# Patient Record
Sex: Male | Born: 1970 | Race: White | Hispanic: No | Marital: Married | Smoking: Former smoker
Health system: Southern US, Community
[De-identification: ages and names within clinical notes are randomized; demographics above are authoritative.]

## PROBLEM LIST (undated history)

## (undated) DIAGNOSIS — K219 Gastro-esophageal reflux disease without esophagitis: Secondary | ICD-10-CM

## (undated) DIAGNOSIS — E785 Hyperlipidemia, unspecified: Secondary | ICD-10-CM

## (undated) DIAGNOSIS — Z789 Other specified health status: Secondary | ICD-10-CM

## (undated) DIAGNOSIS — G4700 Insomnia, unspecified: Secondary | ICD-10-CM

## (undated) HISTORY — PX: TOTAL HIP ARTHROPLASTY: SHX124

## (undated) HISTORY — PX: TONSILLECTOMY: SUR1361

## (undated) HISTORY — DX: Other specified health status: Z78.9

## (undated) HISTORY — DX: Hyperlipidemia, unspecified: E78.5

## (undated) HISTORY — DX: Gastro-esophageal reflux disease without esophagitis: K21.9

## (undated) HISTORY — DX: Insomnia, unspecified: G47.00

---

## 1999-03-18 ENCOUNTER — Emergency Department: Admit: 1999-03-18 | Payer: Self-pay | Source: Emergency Department | Admitting: Emergency Medicine

## 2002-07-18 HISTORY — PX: OTHER SURGICAL HISTORY: SHX169

## 2006-07-27 ENCOUNTER — Ambulatory Visit: Admission: RE | Admit: 2006-07-27 | Payer: Self-pay | Source: Ambulatory Visit | Admitting: Colon & Rectal Surgery

## 2007-02-05 ENCOUNTER — Emergency Department: Admit: 2007-02-05 | Payer: Self-pay | Source: Emergency Department | Admitting: Emergency Medicine

## 2008-09-05 ENCOUNTER — Ambulatory Visit
Admit: 2008-09-05 | Disposition: A | Discharge status: Home / Self Care | Payer: Self-pay | Source: Ambulatory Visit | Admitting: Foot & Ankle Surgery

## 2009-03-09 ENCOUNTER — Ambulatory Visit
Admit: 2009-03-09 | Disposition: A | Discharge status: Home / Self Care | Payer: Self-pay | Source: Ambulatory Visit | Admitting: Adult Reconstructive Orthopaedic Surgery

## 2009-03-09 LAB — CBC AND DIFFERENTIAL
Basophils Absolute: 0 /mm3 (ref 0.0–0.2)
Basophils: 0 % (ref 0–2)
Eosinophils Absolute: 0.3 /mm3 (ref 0.0–0.7)
Eosinophils: 3 % (ref 0–5)
Granulocytes Absolute: 3.8 /mm3 (ref 1.8–8.1)
Hematocrit: 44.9 % (ref 42.0–52.0)
Hgb: 14.8 G/DL (ref 13.0–17.0)
Immature Granulocytes Absolute: 0 CUMM (ref 0.0–0.0)
Immature Granulocytes: 0 % (ref 0–1)
Lymphocytes Absolute: 2.8 /mm3 (ref 0.5–4.4)
Lymphocytes: 38 % (ref 15–41)
MCH: 29.5 PG (ref 28.0–32.0)
MCHC: 33 G/DL (ref 32.0–36.0)
MCV: 89.6 FL (ref 80.0–100.0)
MPV: 9.4 FL (ref 9.4–12.3)
Monocytes Absolute: 0.7 /mm3 (ref 0.0–1.2)
Monocytes: 9 % (ref 0–11)
Neutrophils %: 50 % — ABNORMAL LOW (ref 52–75)
Platelets: 226 /mm3 (ref 140–400)
RBC: 5.01 /mm3 (ref 4.70–6.00)
RDW: 13.2 % (ref 11.5–15.0)
WBC: 7.54 /mm3 (ref 3.50–10.80)

## 2009-03-09 LAB — URINE MICROSCOPIC

## 2009-03-09 LAB — URINALYSIS
Bilirubin, UA: NEGATIVE
Glucose, UA: NEGATIVE
Ketones UA: NEGATIVE
Leukocyte Esterase, UA: NEGATIVE
Nitrite, UA: NEGATIVE
Protein, UR: NEGATIVE
Specific Gravity UA POCT: 1.025 (ref <=1.030)
Urine pH: 6 (ref 5.0–8.0)
Urobilinogen, UA: 0.2

## 2009-03-09 LAB — COMPREHENSIVE METABOLIC PANEL
ALT: 23 U/L (ref 21–72)
AST (SGOT): 25 U/L (ref 20–57)
Albumin/Globulin Ratio: 1.3 (ref 1.1–1.8)
Albumin: 4 G/DL (ref 3.7–5.1)
Alkaline Phosphatase: 90 U/L (ref 43–122)
BUN: 15 MG/DL (ref 7–21)
Bilirubin, Total: 0.2 MG/DL (ref 0.2–1.3)
CO2: 30 MEQ/L (ref 22–31)
Calcium: 9.2 MG/DL (ref 8.6–10.2)
Chloride: 103 MEQ/L (ref 98–107)
Creatinine: 0.7 MG/DL (ref 0.5–1.4)
Globulin: 3 G/DL (ref 2.0–3.7)
Glucose: 122 MG/DL — ABNORMAL HIGH (ref 70–105)
Potassium: 4.6 MEQ/L (ref 3.6–5.0)
Protein, Total: 7 G/DL (ref 6.0–8.0)
Sodium: 142 MEQ/L (ref 136–143)

## 2009-03-09 LAB — ABO/RH

## 2009-03-09 LAB — PT AND APTT
PT INR: 1 {INR} (ref 0.9–1.1)
PT: 12.2 s (ref 10.8–13.3)
PTT: 18 s — ABNORMAL LOW (ref 21–32)

## 2009-03-09 LAB — GFR

## 2009-03-09 LAB — CALCIUM IONIZED-CALC. CERNER: Calcium Ionized Calculated: 2 mEQ/L (ref 1.9–2.3)

## 2009-03-09 LAB — ANTIBODY SCREEN: AB Screen Gel: NEGATIVE

## 2009-03-18 ENCOUNTER — Inpatient Hospital Stay
Admission: RE | Admit: 2009-03-18 | Disposition: A | Discharge status: Home / Self Care | Payer: Self-pay | Source: Ambulatory Visit | Admitting: Adult Reconstructive Orthopaedic Surgery

## 2009-03-18 LAB — CBC
Hematocrit: 38.3 % — ABNORMAL LOW (ref 42.0–52.0)
Hematocrit: 39 % — ABNORMAL LOW (ref 42.0–52.0)
Hgb: 12.5 G/DL — ABNORMAL LOW (ref 13.0–17.0)
Hgb: 12.8 G/DL — ABNORMAL LOW (ref 13.0–17.0)
MCH: 29.5 PG (ref 28.0–32.0)
MCH: 29.6 PG (ref 28.0–32.0)
MCHC: 32.6 G/DL (ref 32.0–36.0)
MCHC: 32.8 G/DL (ref 32.0–36.0)
MCV: 90.3 FL (ref 80.0–100.0)
MCV: 90.1 FL (ref 80.0–100.0)
MPV: 9.6 FL (ref 9.4–12.3)
MPV: 9.7 FL (ref 9.4–12.3)
Platelets: 173 /mm3 (ref 140–400)
Platelets: 174 /mm3 (ref 140–400)
RBC: 4.24 /mm3 — ABNORMAL LOW (ref 4.70–6.00)
RBC: 4.33 /mm3 — ABNORMAL LOW (ref 4.70–6.00)
RDW: 13.5 % (ref 11.5–15.0)
RDW: 13.4 % (ref 11.5–15.0)
WBC: 10.46 /mm3 (ref 3.50–10.80)
WBC: 9.35 /mm3 (ref 3.50–10.80)

## 2009-03-18 LAB — TYPE AND SCREEN: AB Screen Gel: NEGATIVE

## 2009-03-19 LAB — HEMOGLOBIN AND HEMATOCRIT, BLOOD
Hematocrit: 38.3 % — ABNORMAL LOW (ref 42.0–52.0)
Hgb: 12.3 G/DL — ABNORMAL LOW (ref 13.0–17.0)

## 2009-03-19 LAB — BASIC METABOLIC PANEL
BUN: 11 MG/DL (ref 7–21)
CO2: 27 MEQ/L (ref 22–31)
Calcium: 8.2 MG/DL — ABNORMAL LOW (ref 8.6–10.2)
Chloride: 103 MEQ/L (ref 98–107)
Creatinine: 0.7 MG/DL (ref 0.5–1.4)
Glucose: 123 MG/DL — ABNORMAL HIGH (ref 70–105)
Potassium: 4.7 MEQ/L (ref 3.6–5.0)
Sodium: 137 MEQ/L (ref 136–143)

## 2009-03-19 LAB — GFR

## 2009-03-19 LAB — PT/INR
PT INR: 1.2 {INR} — ABNORMAL HIGH (ref 0.9–1.1)
PT: 13.8 s — ABNORMAL HIGH (ref 10.8–13.3)

## 2009-03-20 LAB — HEMOGLOBIN AND HEMATOCRIT, BLOOD
Hematocrit: 36.3 % — ABNORMAL LOW (ref 42.0–52.0)
Hgb: 11.7 G/DL — ABNORMAL LOW (ref 13.0–17.0)

## 2009-03-20 LAB — PT/INR
PT INR: 1.1 {INR} (ref 0.9–1.1)
PT: 13.1 s (ref 10.8–13.3)

## 2011-04-12 LAB — ECG 12-LEAD
Atrial Rate: 88 {beats}/min
P Axis: 38 degrees
P-R Interval: 138 ms
Q-T Interval: 362 ms
QRS Duration: 90 ms
QTC Calculation (Bezet): 438 ms
R Axis: 49 degrees
T Axis: 51 degrees
Ventricular Rate: 88 {beats}/min

## 2011-05-05 NOTE — Op Note (Signed)
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            PROCEDURE:  EXAMINATION UNDER ANESTHESIA, INCISION AND DRAINAGE OF ANTERIOR      ANORECTAL ABSCESS AND SINGLE STAGE FISTULOTOMY.            ANESTHESIA:  IVA.            INDICATION FOR OPERATION:  The patient is a 40 year old male with a      previous history of anal fissure in the distance past, who developed      abscess secondary to the fissure anteriorly, which was incised and drained.      He now presents with recurrence of this abscess and drainage in the      anterior perineum at the previous site of his other surgery.  He now      presents for examination under anesthesia, incision and drainage of abscess      and possible fistulotomy.            DESCRIPTION OF PROCEDURE:  Under successful IV anesthetic the patient was      placed in the left lateral position and anesthesia was further supplemented      with local injection of 0.50% Marcaine with epinephrine.  Once      intersphincteric block was obtained the anus was dilated up to two      fingerbreadths and a medium size Hill-Ferguson retractor was placed in the      anal canal.  There was an anterior anal fistula opening which tunneled out      through the skin, this was incised utilizing electrocautery.  The excess      skin tag was excised.  The tract appeared to be very superficial and did      not impact upon the internal sphincter, only a shallow portion of the      external sphincter muscle.  External skin tag was then completely excised.      The anterior fistula tract was then  curetted with a small bone curette.      Skin tag was sent for permanent section.  Iodoform gauze was placed in the      anal canal, absorbable dressing was placed over the wound.  The patient  left the operating room in good condition.                                                Electronic Signing MD: Judie Bonus, MD  ([id number])            D: 07/27/2006 by Judie Bonus, MD      T: 07/27/2006 by [id number] ([id number]) ([id number])      cc:  Judie Bonus, MD

## 2011-05-06 NOTE — Op Note (Signed)
Account Number: [id number]      Document ID: [id number]      Admit Date: 03/18/2009      Procedure Date: 03/18/2009            Patient Location: [facility]01      Patient Type: I            SURGEON: Meyer Cory MD      ASSISTANT:                  PREOPERATIVE DIAGNOSIS:      Avascular necrosis of the left hip            POSTOPERATIVE DIAGNOSIS:      Avascular necrosis of the left hip            PROCEDURE PERFORMED:      Left total hip arthroplasty.            ANESTHESIA:      Epidural and spinal.            ESTIMATED BLOOD LOSS:      950 mL.            URINE OUTPUT:      250 mL.            INTRAVENOUS FLUIDS:      Crystalloid.            COMPLICATIONS:      None.            DISPOSITION:      To PACU in stable condition.            IMPLANTS:      Zimmer Trilogy 58-mm acetabular shell, lot #[id number]; Zimmer Longevity      cross-linked polyethylene liner, inner diameter size 32 mm, lot #[id number];      Biolox Delta ceramic femoral head, size 32 with a 3.5 mm neck length, lot      #[id number]; and a VerSys Epoch femoral stem extended offset, size 15, full      coat, lot #[id number].  He also had 2 additional 6.5-mm acetabular screws.            INDICATIONS FOR SURGERY:      The patient is a pleasant 40 year old male who was previously diagnosed      with Perthes at age 58.  He has undergone a hip scope as well as a previous      arthrotomy and synovectomy at age 54.  He was evaluated in our clinic      because of his worsening left hip pain that failed both operative and      nonoperative treatments.  He was deemed to be a candidate for total hip      arthroplasty and informed consent was obtained.            DESCRIPTION OF PROCEDURE:      Once the patient was properly consented and the operative site was marked      with the surgeon's initials, the patient was evaluated by the      anesthesiologist who placed an epidural and spinal anesthetic.  He was then      brought to the operating room where he was gently transferred onto the       operating table and a Foley catheter was placed.  He was repositioned into      the lateral decubitus position and left lower extremity was prepared and      draped in the usual  sterile fashion.  After the timeout had been performed,      the planned surgical incision was marked using a sterile marking pen over      the posterior aspect of the greater trochanter for a standard      posterolateral approach.  The skin incision was made using a #10 blade.      This was carried down through the skin and subcutaneous tissue.  Bovie      electrocautery was used to maintain meticulous hemostasis and the fascia      was identified and incised in line with the fibers of the gluteus maximus      muscle.  The Charnley retractor was and the leg was brought into gentle      internal rotation.  Of note, the patient had significant contractures given      his large body habitus and longstanding joint pain.  The skin incision was      then extended to allow proper visualization give his large body habitus.      With the Charnley in place and the leg carefully internally rotated to      protect the sciatic nerve, the trochanteric bursa was elevated and      retractors were placed deep to the gluteus medius tendon.  The short      external rotators and piriformis tendon were identified.  A plane was      developed proximal to the piriformis tendon and deep to the gluteus      minimus.  With the _____ retractor in place, the short external rotators      and piriformis tendon were released at their insertion and a capsulotomy      was performed.  Surgically dislocation was then performed and the planned      femoral neck cut was marked using the lessor trochanter as a reference      point.  The sagittal saw was used to complete the neck cut and the      patient's femoral head was evaluated and was noted to have end-stage      disease as had been seen radiographically.  This was passed off the field      as a specimen.  Next,  retractors were placed anteriorly, inferiorly, as      well as a self-retaining retractor proximally and the acetabulum was      closely inspected.  Once properly visualized the remnant labrum was      resected using a long-handled knife.  Reaming was then begun and performed      in a sequential fashion up to a size 56 reamer.  A trial component was      placed.  This was noted to fit well.  Care was taken to place this in 45      degrees of abduction and 20 degrees of anteversion.  With this in place,      attention was returned to the femur.  Here, a box cutting osteotome was      used to make the entry point into the proximal femur.  The canal finder was      then inserted and reaming was performed, first to lateralize. This was      performed in a sequential fashion up to a size 15.  Broaching was then      performed and the patient was noted to fit well with a size 15 broach.      Trialing was performed  and he was noted to have equal limb lengths and was      stable with an extended offset neck with a +3.5 mm neck length on the 32-mm      femoral head.  He was able to achieve full extension and 45 degrees of      external rotation and was stable with 90 degrees of forward flexion and 60      degrees of internal rotation.  The trial components were then removed.  The      wound was copiously irrigated with normal saline and the final reaming was      performed of the acetabulum and a 58-mm acetabular shell was then inserted.       The polyethylene liner was also inserted and the locking ring was noted to      be intact.  Next the femoral component was then [facility] into place, again      taking great care to match the patient's femoral anteversion.  This was      noted to seat well.  The trunion was cleaned and dried and after final      trialing the ceramic femoral head was placed.  He was taken through a range      of motion and was once again stable as I had previously mentioned and his      limb lengths  were equal as measured at the tibial tuberosities with the      legs in flexion.  The previously placed #5 Tycron in the posterior capsule      and piriformis tendon was then used to perform a posterior repair.  The      wound was copiously irrigated on last time with normal saline and layered      closure was performed with #1 Vicryl for the fascia.  The overlying soft      tissue was reapproximated using 2-0 Vicryl suture.  Ultimate skin closure      was achieved using 4-0 Monocryl in a running subcuticular stitch and      Dermabond skin glue was applied.  A sterile dressing was applied.  The      drapes were removed.  He was repositioned into the supine position and      taken to postanesthesia care unit in stable condition.  Of note, the      attending physician, Dr. Meyer Cory, was present and scrubbed throughout      the entire procedure and there were no complications that occurred during      this case.            POSTOPERATIVE PLAN:      1.  Given the patient's large body habitus, we will place him on 50%      weightbearing for 4 weeks.      2.  He will be on Coumadin for 4 weeks for DVT prophylaxis.      3.  He will receive perioperative prophylactic IV antibiotics.                        Electronic Signing Provider      _______________________________     Date/Time Signed: _____________      Meyer Cory MD ([id number])            D:  03/18/2009 21:45 PM by Dr. Antonietta Jewel, MD ([id number])      T:  03/19/2009 00:12 AM by [id number]          (  Conf: [id number]) (Doc ID: [id number])                  FA:[id number] Thomasenia Bottoms MD

## 2011-05-06 NOTE — Discharge Summary (Signed)
Account Number: [id number]      Document ID: [id number]      Admit Date: 03/18/2009      Discharge Date: 03/20/2009            ATTENDING PHYSICIAN:  Meyer Cory, MD                  PREOPERATIVE DIAGNOSIS:      Left hip avascular necrosis.            DISCHARGE DIAGNOSIS:      Left hip avascular necrosis.            PROCEDURE PERFORMED:      Left total hip arthroplasty on March 18, 2009.            HISTORY OF PRESENT ILLNESS:      The patient is a pleasant 40 year old male who had been previously      diagnosed with  Perthes at the age of 35.  He had undergone a previous hip      arthroscopy, as well as arthrotomy at age 32.  The patient has had      longstanding left hip pain, and at this time had failed both operative and      nonoperative treatment.  He had been evaluated in our clinic, and after      being seen radiographically and clinically he was deemed to be a candidate      for total hip arthroplasty.  He presents for this hospitalization for a      left total hip replacement.            HOSPITAL COURSE:      After undergoing an uncomplicated left total hip arthroplasty on March 18, 2009, by Dr. Meyer Cory, the patient was admitted to orthopedic      surgery ward.  He was provided with perioperative prophylaxis IV Ancef, as      well as Coumadin for DVT prophylaxis.  Internal medicine was consulted to      assist with his medical management and physical therapy was consulted to      assist with his rehabilitation.  Due to the patient's size, he was kept at      50% weightbearing on the left lower extremity and this will continue for a      period of 4 weeks.  In addition, he was kept on posterior hip precautions.      He is provided with both oral and IV pain medication, and was noted to be      transitioned or oral pain medication prior to discharge.  His vital signs      were closely monitored and he remained afebrile throughout this      hospitalization and cardiovascularly stable.  His hematocrit  and      electrolytes were also closely monitored, and he did not require a blood      transfusion.  On the day of discharge, he was tolerating regular diet.  His      wound was healing without incident.  He was voiding without difficulty.      His pain was well-controlled on oral pain medication and he had been      cleared for discharge by both internal medicine, as well as physical      therapy.            DISPOSITION:      To home.  DISCHARGE DIET:      Regular.            DISCHARGE ACTIVITY:      He is posterior hip precautions and 50% weightbearing on the right lower      extremity.  He will be 50% weightbearing for 4 weeks until he is seen at      his follow up appointment.  He may remove his outer dressing in 48 hours,      and is okay for him to shower, but he should not submerge his wound by tub,      bathing, or swimming.            DISCHARGE MEDICATIONS:      In addition, to his previous outpatient regimen we will provide him with a      prescription for Percocet p.r.n. for pain control, Coumadin 2.5 mg daily      for DVT prophylaxis.  He will continue this for 1 month.  His INR will be      drawn and reported to our office so that we may further manage his Coumadin      dosing.  We also provided him with a prescription for multivitamin and a      stool softener.            FOLLOWUP:      We will see the patient back in the orthopedic surgery clinic in 4 weeks      for routine follow up and clinical evaluation.                        Electronic Signing Provider      _______________________________     Date/Time Signed: _____________      Meyer Cory MD ([id number])            D:  03/20/2009 11:38 AM by Dr. Antonietta Jewel, MD ([id number])      T:  03/20/2009 13:15 PM by [id number]          Everlean Cherry: [id number]) (Doc ID: [id number])                        YK:[id number] Thomasenia Bottoms MD

## 2011-05-06 NOTE — Consults (Signed)
Account Number: [id number]      Document ID: [id number]      Admit Date: 03/09/2009      Service Date: 03/08/2009            Patient Location: DISCHARGED 03/09/2009      Patient Type: S            CONSULTING PHYSICIAN: Anselmo Pickler MD            REFERRING PHYSICIAN: Meyer Cory MD                  CONSULTING SERVICE:      Medicine.            REASON FOR CONSULTATION:      Preoperative medical clearance.            TYPE OF SURGERY:      Left total hip replacement.            DATE OF SURGERY:      March 18, 2009.            HOSPITAL:      [facility].            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            PAST MEDICAL HISTORY:      Otherwise significant for exogenous obesity.            PAST SURGICAL HISTORY:      Hemorrhoidectomy, left hip arthrotomy, and tonsillectomy/adenoidectomy.            MEDICATIONS:      Vicodin and Valium on a p.r.n. basis.            ALLERGIES:      CELEBREX causes itching.            SOCIAL HISTORY:      He has 1 to 2 alcoholic beverages daily on most days and uses marijuana.      He stopped smoking several months ago and has a 15-pack-year smoking.            FAMILY HISTORY:      Noncontributory.            PREVIOUS ANESTHESIA REACTIONS:      None.            BLEEDING TENDENCIES:      None.            REVIEW OF SYSTEMS:      Denies exertional chest pain, shortness of breath, dizziness,      lightheadedness, or palpitations.  No headache, double vision, dysarthria,      or unilateral extremity weakness.  No recent change in bowel or bladder      function.  He snores but has never been diagnosed with sleep apnea.            PHYSICAL EXAMINATION:      GENERAL:  Reveals a normally developed, obese Caucasian gentleman.      VITAL SIGNS:  Height  is 5 feet 11 inches, weight is 142.8 kg., blood      pressure 152/76, heart rate is 88, respirations 16.  He is awake, alert,      and oriented to person, place, and date.      HEENT:  Pupils equal and reactive to light.  Extraocular movements intact.      NECK:  Supple, no thyromegaly.      LUNGS:  Clear, no wheezes.      HEART:  S1, S2, regular with no audible murmur.      ABDOMEN:  Soft, protuberant, nontender.  Bowel sounds normal.      GENITAL/RECTAL:  Not done.      NEUROLOGIC:  Nonfocal.  Cranial nerves are intact.      EXTREMITIES:  No edema.  Homans sign is negative.  Peripheral pulses are      palpable.            LABORATORY AND DIAGNOSTIC TESTING:      CMP is within normal limits.  CBC is within normal limits.  INR is 1.0,      APTT is 18.  UA is negative except for trace blood, 2 to 5 RBCs.  EKG:      Normal sinus rhythm, rate 88, normal intervals, normal axis, within normal      limits.            IMPRESSION:      1.  Left total hip replacement per orthopedic surgery.      2.  DVT prophylaxis per protocol.      3.  GI prophylaxis per protocol.      4.  Exogenous obesity.      5.  Marijuana use which he was advised to stop today in anticipation of      surgery.      6.  Alcohol use, which he was advised to stop as of today in      anticipation of surgery.      7.  CELEBREX allergy.      8.  High blood pressure reading today for which he will be given Toprol-XL      25 mg to begin today.  It will be held for a systolic blood pressure of      less than 110 mmHg or heart rate less than 50 beats per minute.            PLAN:      He is medically cleared for proposed surgery and should be at low risk for      this intermediate risk surgery.  He was advised to not take aspirin, NSAIDs      or any complimentary alternative medications between now and the time of      surgery.                        Electronic Signing Provider      _______________________________     Date/Time Signed: _____________      Anselmo Pickler MD [phone])            D:  03/09/2009 16:49 PM by Dr. Anselmo Pickler, MD [phone])      T:  03/10/2009 06:53 AM by [id number]          Everlean Cherry: [id number]) (Doc ID: [id number])                  KG:URKYH Lossie Faes MD      Meyer Cory MD

## 2011-05-17 ENCOUNTER — Emergency Department
Admit: 2011-05-17 | Discharge: 2011-05-17 | Disposition: A | Discharge status: Home / Self Care | Payer: Self-pay | Source: Emergency Department | Admitting: Emergency Medicine

## 2012-04-25 ENCOUNTER — Other Ambulatory Visit: Payer: Self-pay | Admitting: Chiropractor

## 2012-11-15 DIAGNOSIS — J40 Bronchitis, not specified as acute or chronic: Secondary | ICD-10-CM

## 2012-11-15 HISTORY — DX: Bronchitis, not specified as acute or chronic: J40

## 2012-12-04 ENCOUNTER — Emergency Department: Payer: BLUE CROSS/BLUE SHIELD

## 2012-12-04 ENCOUNTER — Inpatient Hospital Stay: Payer: BLUE CROSS/BLUE SHIELD | Admitting: Hospitalist

## 2012-12-04 ENCOUNTER — Inpatient Hospital Stay
Admission: EM | Admit: 2012-12-04 | Discharge: 2012-12-06 | DRG: 202 | Disposition: A | Discharge status: Home / Self Care | Payer: BLUE CROSS/BLUE SHIELD | Attending: Internal Medicine | Admitting: Internal Medicine

## 2012-12-04 DIAGNOSIS — J45909 Unspecified asthma, uncomplicated: Principal | ICD-10-CM | POA: Diagnosis present

## 2012-12-04 DIAGNOSIS — R062 Wheezing: Secondary | ICD-10-CM | POA: Diagnosis present

## 2012-12-04 DIAGNOSIS — Z6841 Body Mass Index (BMI) 40.0 and over, adult: Secondary | ICD-10-CM

## 2012-12-04 DIAGNOSIS — G8929 Other chronic pain: Secondary | ICD-10-CM | POA: Diagnosis present

## 2012-12-04 DIAGNOSIS — Z96649 Presence of unspecified artificial hip joint: Secondary | ICD-10-CM

## 2012-12-04 DIAGNOSIS — M549 Dorsalgia, unspecified: Secondary | ICD-10-CM | POA: Diagnosis present

## 2012-12-04 DIAGNOSIS — E669 Obesity, unspecified: Secondary | ICD-10-CM | POA: Diagnosis present

## 2012-12-04 DIAGNOSIS — E861 Hypovolemia: Secondary | ICD-10-CM | POA: Diagnosis present

## 2012-12-04 DIAGNOSIS — Z87891 Personal history of nicotine dependence: Secondary | ICD-10-CM

## 2012-12-04 LAB — COMPREHENSIVE METABOLIC PANEL
ALT: 35 U/L (ref 0–55)
AST (SGOT): 23 U/L (ref 5–34)
Albumin/Globulin Ratio: 1.2 (ref 0.9–2.2)
Albumin: 4 g/dL (ref 3.5–5.0)
Alkaline Phosphatase: 90 U/L (ref 40–150)
BUN: 10 mg/dL (ref 9.0–21.0)
Bilirubin, Total: 0.6 mg/dL (ref 0.2–1.2)
CO2: 20 — ABNORMAL LOW (ref 22–29)
Calcium: 9.6 mg/dL (ref 8.5–10.5)
Chloride: 106 (ref 98–107)
Creatinine: 0.8 mg/dL (ref 0.7–1.3)
Globulin: 3.4 g/dL (ref 2.0–3.6)
Glucose: 83 mg/dL (ref 70–100)
Potassium: 4.3 (ref 3.5–5.1)
Protein, Total: 7.4 g/dL (ref 6.0–8.3)
Sodium: 138 (ref 136–145)

## 2012-12-04 LAB — CBC AND DIFFERENTIAL
Basophils Absolute Automated: 0.02 (ref 0.00–0.20)
Basophils Automated: 0 %
Eosinophils Absolute Automated: 0.13 (ref 0.00–0.70)
Eosinophils Automated: 1 %
Hematocrit: 48 % (ref 42.0–52.0)
Hgb: 16.4 g/dL (ref 13.0–17.0)
Immature Granulocytes Absolute: 0.05
Immature Granulocytes: 0 %
Lymphocytes Absolute Automated: 3.75 (ref 0.50–4.40)
Lymphocytes Automated: 31 %
MCH: 30.1 pg (ref 28.0–32.0)
MCHC: 34.2 g/dL (ref 32.0–36.0)
MCV: 88.2 fL (ref 80.0–100.0)
MPV: 9.3 fL — ABNORMAL LOW (ref 9.4–12.3)
Monocytes Absolute Automated: 0.61 (ref 0.00–1.20)
Monocytes: 5 %
Neutrophils Absolute: 7.67 (ref 1.80–8.10)
Neutrophils: 63 %
Nucleated RBC: 0 (ref 0–1)
Platelets: 219 (ref 140–400)
RBC: 5.44 (ref 4.70–6.00)
RDW: 13 % (ref 12–15)
WBC: 12.23 — ABNORMAL HIGH (ref 3.50–10.80)

## 2012-12-04 LAB — ECG 12-LEAD
Atrial Rate: 105 {beats}/min
P Axis: 47 degrees
P-R Interval: 122 ms
Q-T Interval: 336 ms
QRS Duration: 84 ms
QTC Calculation (Bezet): 444 ms
R Axis: 62 degrees
T Axis: 56 degrees
Ventricular Rate: 105 {beats}/min

## 2012-12-04 LAB — GFR: EGFR: >60

## 2012-12-04 MED ORDER — OXYCODONE-ACETAMINOPHEN 5-325 MG PO TABS
1.0000 | ORAL_TABLET | ORAL | Status: DC | PRN
Start: 2012-12-04 — End: 2012-12-06
  Administered 2012-12-05 (×3): 1 via ORAL
  Filled 2012-12-04 (×3): qty 1

## 2012-12-04 MED ORDER — ALBUTEROL SULFATE (5 MG/ML) 0.5% IN NEBU
7.5000 mg | INHALATION_SOLUTION | Freq: Once | RESPIRATORY_TRACT | Status: AC
Start: 2012-12-04 — End: 2012-12-04
  Administered 2012-12-04: 7.5 mg via RESPIRATORY_TRACT
  Filled 2012-12-04 (×2): qty 1.5

## 2012-12-04 MED ORDER — PREDNISONE 20 MG PO TABS
60.0000 mg | ORAL_TABLET | Freq: Once | ORAL | Status: AC
Start: 2012-12-04 — End: 2012-12-04
  Administered 2012-12-04: 60 mg via ORAL
  Filled 2012-12-04: qty 3

## 2012-12-04 MED ORDER — ENOXAPARIN SODIUM 40 MG/0.4ML SC SOLN
40.0000 mg | Freq: Every day | SUBCUTANEOUS | Status: DC
Start: 2012-12-04 — End: 2012-12-06
  Administered 2012-12-05: 40 mg via SUBCUTANEOUS
  Filled 2012-12-04: qty 0.4

## 2012-12-04 MED ORDER — SODIUM CHLORIDE 0.9 % IV SOLN
500.0000 mg | Freq: Every evening | INTRAVENOUS | Status: DC
Start: 2012-12-04 — End: 2012-12-06
  Administered 2012-12-04 – 2012-12-05 (×2): 500 mg via INTRAVENOUS
  Filled 2012-12-04 (×2): qty 500

## 2012-12-04 MED ORDER — ACETAMINOPHEN 325 MG PO TABS
650.0000 mg | ORAL_TABLET | ORAL | Status: DC | PRN
Start: 2012-12-04 — End: 2012-12-06

## 2012-12-04 MED ORDER — ALBUTEROL SULFATE (5 MG/ML) 0.5% IN NEBU
5.0000 mg | INHALATION_SOLUTION | RESPIRATORY_TRACT | Status: DC
Start: 2012-12-04 — End: 2012-12-06
  Administered 2012-12-05 – 2012-12-06 (×6): 5 mg via RESPIRATORY_TRACT
  Filled 2012-12-04 (×7): qty 1

## 2012-12-04 MED ORDER — IPRATROPIUM BROMIDE 0.02 % IN SOLN
1.0000 mg | Freq: Once | RESPIRATORY_TRACT | Status: AC
Start: 2012-12-04 — End: 2012-12-04
  Administered 2012-12-04: 1 mg via RESPIRATORY_TRACT
  Filled 2012-12-04: qty 5

## 2012-12-04 MED ORDER — ALBUTEROL SULFATE (5 MG/ML) 0.5% IN NEBU
7.5000 mg | INHALATION_SOLUTION | Freq: Once | RESPIRATORY_TRACT | Status: AC
Start: 2012-12-04 — End: 2012-12-04
  Administered 2012-12-04: 7.5 mg via RESPIRATORY_TRACT
  Filled 2012-12-04: qty 1.5

## 2012-12-04 MED ORDER — ZOLPIDEM TARTRATE 5 MG PO TABS
5.0000 mg | ORAL_TABLET | Freq: Every evening | ORAL | Status: DC | PRN
Start: 2012-12-04 — End: 2012-12-06

## 2012-12-04 MED ORDER — METHYLPREDNISOLONE SODIUM SUCC 125 MG IJ SOLR
60.0000 mg | Freq: Three times a day (TID) | INTRAMUSCULAR | Status: DC
Start: 2012-12-04 — End: 2012-12-05
  Administered 2012-12-05: 60 mg via INTRAVENOUS
  Filled 2012-12-04: qty 2

## 2012-12-04 NOTE — ED Notes (Signed)
Cough, congestion, shortness of breath, sputum.  Pt saw PMD and finished Z-pack and Prednisone but not getting better.  Sx since 1.5 weeks ago. No cp

## 2012-12-04 NOTE — H&P (Signed)
ADMISSION HISTORY AND PHYSICAL EXAM    Date Time: 12/04/2012 8:24 PM  Patient Name: Jeff Fuller  Attending Physician: Ty Hilts*  Primary Care Physician: Aleen Campi, MD    CC: Shortness of breath, wheezing and cough      Assessment:   Principal Problem:   *Asthmatic bronchitis  Active Problems:   Volume depletion   Wheezing without diagnosis of asthma   42 yo male with severe asthmatic bronchitis with a prolonged course.    Plan:     Asthmatic Bronchitis - IV Solumedrol - high dose as he has been wheezing significantly  -IV abx - IV Zithromax as he did respond to Zithromax po in the past  - Anti Histamine - Zyrtec  -O2   - Nebs - Albuterol scheduled q 4 hours tonight and later space them out as per improvement  -Since he has had similar episodes every 1-2 years, may consider Pulmonary consult     Volume depletion - gentle hydration    Hip pain  -prn Percocet, tylenol        Disposition:     Anticipated medical stability for discharge: May,  22 - Afternoon  Service status: Inpatient: risk of progressive disease and risk of readmission  Reason for ongoing hospitalization: wheezing  Anticipated discharge needs:none      History of Presenting Illness:   Jeff Fuller is a 42 y.o. male who does not have a hx of asthma, who has had 12 days of cough and wheezing. Initially he started out with cough with 'Black ' Sputum. He was started on Z-pack and a Prednisone taper which helped, but never really resolved. Since his last dose of meds, his symptoms have worsened. He now reports constant SOB, "a really nasty cough". No expectoration. Slight fever. PCP gave him an albuterol inhaler which really did not help his symptoms.   He smoked in the past, stopped 2 yrs ago.   Patient denies any CP, N, V. No abdominal pain, rash or diarrhea. No recent travel.  He has chronic back pain and uses Percocet as needed.      Past Medical History:   Asthmatic bronchitis - every 1-2 years  Hx Perthes at the age of 71. He  had undergone a previous hip   arthroscopy, as well as arthrotomy at age 38  Left Hip avascular necrosis 2010    Available old records reviewed, including: Sept 2010     Past Surgical History:     Past Surgical History   Procedure Date   . Total hip arthroplasty        Family History:   Non-contributory    Social History:     History   Smoking status   . Former Smoker   Smokeless tobacco   . Not on file     History   Alcohol Use      Comment: social     History   Drug Use No       Allergies:     Allergies   Allergen Reactions   . Celebrex (Celecoxib)        Medications:     Current/Home Medications    OXYCODONE-ACETAMINOPHEN (LYNOX) 7.5-300 MG PER TABLET    Take 1 tablet by mouth every 4 (four) hours as needed.    ZOLPIDEM TARTRATE (AMBIEN PO)    Take by mouth.        Method by which medications were confirmed on admission:with patient    Review of Systems:  All other systems were reviewed and are negative except: per HPI    Physical Exam:   Patient Vitals for the past 24 hrs:   BP Temp Pulse Resp SpO2 Height Weight   12/04/12 1932 112/60 mmHg - 89  16  98 % - -   12/04/12 1907 145/79 mmHg - 98  22  97 % - -   12/04/12 1624 160/69 mmHg - 100  26  98 % - -   12/04/12 1514 150/89 mmHg - - - - - -   12/04/12 1505 134/72 mmHg 99 F (37.2 C) 102  105  97 % 1.803 m (5\' 11" ) 145.151 kg (320 lb)     Body mass index is 44.65 kg/(m^2).  No intake or output data in the 24 hours ending 12/04/12 2024    General: awake, alert, oriented x 3; no acute distress.  HEENT: perrla, eomi, sclera anicteric  oropharynx clear without lesions, mucous membranes moist  Neck: supple, no lymphadenopathy, no thyromegaly, no JVD, no carotid bruits  Cardiovascular: regular rate and rhythm, no murmurs, rubs or gallops  Lungs:significant wheezing with both inspiration and expiration on auscultation bilaterally, no  rales  Abdomen: soft, non-tender, non-distended; no palpable masses, no hepatosplenomegaly, normoactive bowel sounds, no rebound or  guarding  Extremities: no clubbing, cyanosis, or edema  Neuro: cranial nerves grossly intact, strength 5/5 in upper and lower extremities, sensation intact,   Skin: no rashes or lesions noted    Labs:     Results     Procedure Component Value Units Date/Time    CBC with Differential [[id number]]  (Abnormal) Collected:12/04/12 1546    Specimen Information:Blood / Blood Updated:12/04/12 1628     WBC 12.23 (H)      RBC 5.44      Hgb 16.4 g/dL      Hematocrit 40.9 %      MCV 88.2 fL      MCH 30.1 pg      MCHC 34.2 g/dL      RDW 13 %      Platelets 219      MPV 9.3 (L) fL      Neutrophils 63 %      Lymphocytes Automated 31 %      Monocytes 5 %      Eosinophils Automated 1 %      Basophils Automated 0 %      Immature Granulocyte 0 %      Nucleated RBC 0      Neutrophils Absolute 7.67      Abs Lymph Automated 3.75      Abs Mono Automated 0.61      Abs Eos Automated 0.13      Absolute Baso Automated 0.02      Absolute Immature Granulocyte 0.05     Comprehensive Metabolic Panel (CMP) [[id number]]  (Abnormal) Collected:12/04/12 1546    Specimen Information:Blood Updated:12/04/12 1618     Glucose 83 mg/dL      BUN 95.6 mg/dL      Creatinine 0.8 mg/dL      Sodium 213      Potassium 4.3      Chloride 106      CO2 20 (L)      Calcium 9.6 mg/dL      Protein, Total 7.4 g/dL      Albumin 4.0 g/dL      AST (SGOT) 23 U/L      ALT 35 U/L      Alkaline Phosphatase 90  U/L      Bilirubin, Total 0.6 mg/dL      Globulin 3.4 g/dL      Albumin/Globulin Ratio 1.2     GFR [[id number]] Collected:12/04/12 1546     EGFR >60.0 Updated:12/04/12 1618    Rapid Influenza A/B Antigens [[id number]] Collected:12/04/12 1523    Specimen Information:Nasopharyngeal / Nasal Aspirate Updated:12/04/12 1613    Narrative:    ORDER#: [id number]                                    ORDERED BY: Celine Mans  SOURCE: Nasal Aspirate                               COLLECTED:  12/04/12 15:23  ANTIBIOTICS AT COLL.:                                RECEIVED :  12/04/12  15:54  Influenza Rapid Antigen A&B                FINAL       12/04/12 16:13  12/04/12   Negative for Influenza A and B             Reference Range: Negative            Imaging personally reviewed, including: CXR - no infiltrates    Safety Checklist  DVT prophylaxis:  CHEST guideline (See page e199S) Chemical and Mechanical   Foley:  [place] R[facility] Foley protocol Not present   IVs:  Peripheral IV   PT/OT: Not needed   Daily CBC & or Chem ordered:  SHM/ABIM guidelines (see #5) Yes, due to clinical and lab instability       Signed by: Laretta Bolster, MD   PP:IRJJOA, Laney Potash, MD

## 2012-12-04 NOTE — ED Provider Notes (Signed)
This patient was seen by me in triage and initial testing was ordered based on presenting complaint. Care was expedited. I am not the primary provider for this patient.   42 y.o. male without sig PMHX here with cough sob wheezing. Just finished Z pak and prednisone  Diffuse wheezing  Neb ordered  resp aware    Joice Lofts, MD  12/04/12 [phone]

## 2012-12-04 NOTE — ED Provider Notes (Signed)
Physician/Midlevel provider first contact with patient: 12/04/12 1513         [facility] Emergency Department  Provider Note    Diagnosis     Final diagnoses:   Acute bronchitis with bronchospasm       Disposition     ED Disposition     Admit Bed Type: Telemetry [5]  Admitting Physician: Marene Lenz [[id number]]  Patient Class: Hospital Outpatient Surgery (Amb Proc) [106]            HPI     Chief Complaint   Patient presents with   . Cough   . Nasal Congestion   . Shortness of Breath     Jeff Fuller is a 42 y.o. male p/w ~12 days cough worsening over time. Pt saw PCP early on, was given z-pack and prednisone which helped, but after finishing both his sxs have worsened. Pt reports constant SOB, "a really nasty cough". Slight fever. PCP gave albuterol inhaler which did not help sxs. Pt reports feet tingling when coughing. Pt has hx smoker for many years, stopped 2 yrs ago. No CP. Pt takes percocet for chronic back pain, no other daily meds.     Onset: 12 days  Frequency:persistent  Modifying factors:prednisone and z-pack  Associated sx:SOB, Fever  Pertinent neg:CP    PMD/Specialists   Sedghi    Past History   History reviewed. No pertinent past medical history.  Past Surgical History   Procedure Date   . Total hip arthroplasty      No family history on file.  History     Social History   . Marital Status: Single     Spouse Name: N/A     Number of Children: N/A   . Years of Education: N/A     Occupational History   . Not on file.     Social History Main Topics   . Smoking status: Former Games developer   . Smokeless tobacco: Not on file   . Alcohol Use:       Comment: social   . Drug Use: No   . Sexually Active: Not on file     Other Topics Concern   . Not on file     Social History Narrative   . No narrative on file       ROS   -Documented in HPI.  Otherwise all other systems reviewed and negative.  -Nursing notes reviewed by me.   -Past Medical/Surgical/Family/Social History reviewed by me.     Physical Exam      Patient Vitals for the past 24 hrs:   BP Temp Pulse Resp SpO2 Height Weight   12/04/12 1907 145/79 mmHg - 98  22  97 % - -   12/04/12 1624 160/69 mmHg - 100  26  98 % - -   12/04/12 1514 150/89 mmHg - - - - - -   12/04/12 1505 134/72 mmHg 99 F (37.2 C) 102  105  97 % 1.803 m 145.151 kg     Pulse Oximetry Analysis - Normal  Vital Signs Reviewed    GEN: . Well appearing; No acute distress.   Head: Normocephalic, Atraumatic.    Eyes: PERRL, EOMI.    ENT: Posterior oropharynx wnl. Moist mucus membranes.    Neck: No midline tenderness, full range of motion.    Chest:  *diffuse expiratory wheezing, NO respiratory distress. Non-tender chest wall.     CV: RRR, normal S1 S2, NO murmur.  Abd: soft, non-tender, NO distention, NO rebound/guarding.           Back:  NO CVA tenderness, NO midline tenderness on palpation.       Extremities: NO edema. 2+ pp, NO calf tenderness.        Neuro: grossly neuro intact, moving all extremities, A&Ox3.        Skin: Warm and dry. NO rash.        Psych: Normal affect. Normal behavior.         MDM / DDX / ED Course   DDx:  Copd, bronchitis, flu, r/o pna, r/o chf  Plan: ekg, cxr, labs, nebs, steroids, reassess    ED Course:   Still wheezing after 15mg  of albuterol, will admit    7:13 PM  Spoke to Dr. Hollice Espy will admit to [facility]      EKG Interpretation   By : Gerome Sam, MD at 3:29 PM on 12/04/2012   Rate: tachycardic  Rhythm: sinus rhythm  Axis: Normal  ST Segments: Normal ST segments  T waves: Normal T Waves  Conduction: No blocks  Intervals: WNL  Impression: Sinus tach      Critical Care Time   30-74 minutes      Due to the high risk of critical illness or multi-organ failure at initial presentation and/or during ED course.    System(s) at risk for compromise:  pulmonary  Critical Diagnosis:   1. Acute bronchitis with bronchospasm    2. Volume depletion    3. Wheezing without diagnosis of asthma        The patient was Hypotense:   No     The patient was Hypoxic:   No      This does not including time spent performing other reported procedures or services.   Critical care time involved full attention to the patient's condition and included:     (+)Review of nursing notes and/or old charts  (+)Review of medications, allergies, and vital signs    (+)Documentation time  (+)Consultant collaboration on findings and treatment options    (+)Care, transfer of care, and discharge plans  (+) Ordering, interpreting, and reviewing diagnostic studies/tab tests    (+)Obtaining necessary history from family, EMS, nursing home staff and/or treating physicians.           First MD: Gerome Sam  Laboratory results reviewed by EDP:  yes  Radiology results reviewed by EDP:  yes  Independent visualization/interpretation of images and/or tracings by EDP: yes  Discussed patient management with other provider(s): yes   Old records obtained and reviewed by [facility]    MEDS GIVEN     ED Medication Orders      Start     Status Ordering Provider    12/04/12 1711   albuterol (PROVENTIL) nebulizer solution 7.5 mg   RT - Once      Route: Nebulization  Ordered Dose: 7.5 mg         Last MAR action:  Given Addysyn Fern [place]    12/04/12 1605   predniSONE (DELTASONE) tablet 60 mg   Once      Route: Oral  Ordered Dose: 60 mg         Last MAR action:  Given Antinio Sanderfer [place]    12/04/12 1508   albuterol (PROVENTIL) nebulizer solution 7.5 mg   RT - Once      Route: Nebulization  Ordered Dose: 7.5 mg         Last MAR action:  Given Joice Lofts    12/04/12 1508   ipratropium (ATROVENT) 0.02 % nebulizer solution 1 mg   RT - Once      Route: Nebulization  Ordered Dose: 1 mg         Last MAR action:  Given GIRMA, BELETSHACHEW R                RESULTS   Labs:     Results     Procedure Component Value Units Date/Time    CBC with Differential [[id number]]  (Abnormal) Collected:12/04/12 1546    Specimen Information:Blood / Blood Updated:12/04/12 1628     WBC 12.23 (H)      RBC 5.44      Hgb 16.4  g/dL      Hematocrit 02.7 %      MCV 88.2 fL      MCH 30.1 pg      MCHC 34.2 g/dL      RDW 13 %      Platelets 219      MPV 9.3 (L) fL      Neutrophils 63 %      Lymphocytes Automated 31 %      Monocytes 5 %      Eosinophils Automated 1 %      Basophils Automated 0 %      Immature Granulocyte 0 %      Nucleated RBC 0      Neutrophils Absolute 7.67      Abs Lymph Automated 3.75      Abs Mono Automated 0.61      Abs Eos Automated 0.13      Absolute Baso Automated 0.02      Absolute Immature Granulocyte 0.05     Comprehensive Metabolic Panel (CMP) [[id number]]  (Abnormal) Collected:12/04/12 1546    Specimen Information:Blood Updated:12/04/12 1618     Glucose 83 mg/dL      BUN 47.4 mg/dL      Creatinine 0.8 mg/dL      Sodium 259      Potassium 4.3      Chloride 106      CO2 20 (L)      Calcium 9.6 mg/dL      Protein, Total 7.4 g/dL      Albumin 4.0 g/dL      AST (SGOT) 23 U/L      ALT 35 U/L      Alkaline Phosphatase 90 U/L      Bilirubin, Total 0.6 mg/dL      Globulin 3.4 g/dL      Albumin/Globulin Ratio 1.2     GFR [[id number]] Collected:12/04/12 1546     EGFR >60.0 Updated:12/04/12 1618    Rapid Influenza A/B Antigens [[id number]] Collected:12/04/12 1523    Specimen Information:Nasopharyngeal / Nasal Aspirate Updated:12/04/12 1613    Narrative:    ORDER#: [id number]                                    ORDERED BY: Celine Mans  SOURCE: Nasal Aspirate                               COLLECTED:  12/04/12 15:23  ANTIBIOTICS AT COLL.:  RECEIVED :  12/04/12 15:54  Influenza Rapid Antigen A&B                FINAL       12/04/12 16:13  12/04/12   Negative for Influenza A and B             Reference Range: Negative            Radiology Results:     Radiology Results (24 Hour)     Procedure Component Value Units Date/Time    XR Chest 2 Views [[id number]] Collected:12/04/12 1647    Order Status:Completed  Updated:12/04/12 1652    Narrative:      CLINICAL HISTORY: Cough and shortness of breath    PA and  lateral views were submitted.  Comparison has been made to the  prior study dated 05/17/2011.    Cardiomediastinal contour is unchanged. There are increased interstitial  markings with bronchial wall thickening suggested. Findings are  consistent with airway inflammation (acute versus chronic versus acute  on chronic). No dense focal consolidation, effusion or pneumothorax is  seen.      Impression:      Airway inflammation.    Latanya Maudlin, MD   12/04/2012 4:48 PM        ___________________________    New prescriptions given to patient:  New Prescriptions    No medications on file       Home Medications:   Current facility-administered medications:[COMPLETED] albuterol (PROVENTIL) nebulizer solution 7.5 mg, 7.5 mg, Nebulization, Once, Girma, Beletshachew R, MD, 7.5 mg at 12/04/12 1524;  [COMPLETED] albuterol (PROVENTIL) nebulizer solution 7.5 mg, 7.5 mg, Nebulization, Once, Marg Macmaster, Placido Sou, MD, 7.5 mg at 12/04/12 1718  [COMPLETED] ipratropium (ATROVENT) 0.02 % nebulizer solution 1 mg, 1 mg, Nebulization, Once, Girma, Beletshachew R, MD, 1 mg at 12/04/12 1524;  [COMPLETED] predniSONE (DELTASONE) tablet 60 mg, 60 mg, Oral, Once, Mariusz Jubb, Placido Sou, MD, 60 mg at 12/04/12 1623  Current outpatient prescriptions:oxyCODONE-acetaminophen (LYNOX) 7.5-300 MG per tablet, Take 1 tablet by mouth every 4 (four) hours as needed., Disp: , Rfl: ;  Zolpidem Tartrate (AMBIEN PO), Take by mouth., Disp: , Rfl:     Allergies:   Allergies   Allergen Reactions   . Celebrex (Celecoxib)                    I was acting as a Neurosurgeon for [employer], Otho Perl* on [employer] Team: Scribe: Christin Fudge   I am the first provider for this patient and I personally performed the services documented. Treatment Team: Scribe: Christin Fudge is scribing for me on Fuller,Jeff. This note accurately reflects work and decisions made by me.  [employer]*      Lakendria Nicastro, Placido Sou, MD  12/06/12  [phone]

## 2012-12-05 LAB — BASIC METABOLIC PANEL
BUN: 13 mg/dL (ref 9.0–21.0)
CO2: 20 — ABNORMAL LOW (ref 22–29)
Calcium: 8.9 mg/dL (ref 8.5–10.5)
Chloride: 107 (ref 98–107)
Creatinine: 0.7 mg/dL (ref 0.7–1.3)
Glucose: 89 mg/dL (ref 70–100)
Potassium: 4.4 (ref 3.5–5.1)
Sodium: 138 (ref 136–145)

## 2012-12-05 LAB — ECG 12-LEAD
Atrial Rate: 105 {beats}/min
P Axis: 47 degrees
P-R Interval: 122 ms
Q-T Interval: 336 ms
QRS Duration: 84 ms
QTC Calculation (Bezet): 444 ms
R Axis: 62 degrees
T Axis: 56 degrees
Ventricular Rate: 105 {beats}/min

## 2012-12-05 LAB — CBC AND DIFFERENTIAL
Basophils Absolute Automated: 0.01 (ref 0.00–0.20)
Basophils Automated: 0 %
Eosinophils Absolute Automated: 0.05 (ref 0.00–0.70)
Eosinophils Automated: 0 %
Hematocrit: 44.3 % (ref 42.0–52.0)
Hgb: 14.9 g/dL (ref 13.0–17.0)
Immature Granulocytes Absolute: 0.07 — ABNORMAL HIGH
Immature Granulocytes: 1 %
Lymphocytes Absolute Automated: 2.97 (ref 0.50–4.40)
Lymphocytes Automated: 24 %
MCH: 29.6 pg (ref 28.0–32.0)
MCHC: 33.6 g/dL (ref 32.0–36.0)
MCV: 88.1 fL (ref 80.0–100.0)
MPV: 9.6 fL (ref 9.4–12.3)
Monocytes Absolute Automated: 1.12 (ref 0.00–1.20)
Monocytes: 9 %
Neutrophils Absolute: 8.13 — ABNORMAL HIGH (ref 1.80–8.10)
Neutrophils: 66 %
Nucleated RBC: 0 (ref 0–1)
Platelets: 232 (ref 140–400)
RBC: 5.03 (ref 4.70–6.00)
RDW: 13 % (ref 12–15)
WBC: 12.35 — ABNORMAL HIGH (ref 3.50–10.80)

## 2012-12-05 LAB — GFR: EGFR: >60

## 2012-12-05 MED ORDER — BENZONATATE 100 MG PO CAPS
100.0000 mg | ORAL_CAPSULE | Freq: Two times a day (BID) | ORAL | Status: DC | PRN
Start: 2012-12-05 — End: 2012-12-06
  Administered 2012-12-05: 100 mg via ORAL
  Filled 2012-12-05 (×2): qty 1

## 2012-12-05 MED ORDER — FLUTICASONE-SALMETEROL 115-21 MCG/ACT IN AERO
2.0000 | INHALATION_SPRAY | Freq: Two times a day (BID) | RESPIRATORY_TRACT | Status: DC
Start: 2012-12-05 — End: 2012-12-06
  Administered 2012-12-05: 2 via RESPIRATORY_TRACT
  Filled 2012-12-05: qty 8

## 2012-12-05 MED ORDER — METHYLPREDNISOLONE SODIUM SUCC 40 MG IJ SOLR
40.0000 mg | Freq: Three times a day (TID) | INTRAMUSCULAR | Status: DC
Start: 2012-12-05 — End: 2012-12-06
  Administered 2012-12-05 – 2012-12-06 (×3): 40 mg via INTRAVENOUS
  Filled 2012-12-05 (×3): qty 1

## 2012-12-05 NOTE — Progress Notes (Signed)
MEDICINE PROGRESS NOTE    Date Time: 12/05/2012 8:31 AM  Patient Name: Jeff Fuller  Attending Physician: Duard Larsen,*    Assessment:   Principal Problem:   *Asthmatic bronchitis still wheezing this am, sick for 10 days with failure of outpatient treatment.  Active Problems:   Volume depletion resolved with fluids   Wheezing without diagnosis of asthma   Chronic low back pain stable   Obesity  Ex nicotine abuse quit 2 years ago              Plan:   1. Iv steroids  2. Add adv air  3. Duo nebs  4. Discharge in am if better  Case discussed with: patient    Safety Checklist:     DVT prophylaxis:  CHEST guideline (See page e199S) Chemical   Foley:  [place] R[facility] Foley protocol Not present   IVs:  Peripheral IV   PT/OT: Not needed   Daily CBC & or Chem ordered:  SHM/ABIM guidelines (see #5) Will d/c as no longer needed   Reference for approximate charges of common labs: CBC auto diff - $76  BMP - $99  Mg - $79    Lines:     Active PICC Line / CVC Line / PIV Line / Drain / Airway / Intraosseous Line / Epidural Line / ART Line / Line / Wound / Pressure Ulcer / NG/OG Tube     Name   Placement date   Placement time   Site   Days    Peripheral IV 12/04/12 Right Antecubital  12/04/12   1604   Antecubital   less than 1           Disposition:     Today's date: 12/05/2012  Length of Stay: 1  Anticipated medical stability for discharge: May,  22 - Morning  Reason for ongoing hospitalization: Pulm support, duo nebs  Anticipated discharge needs: none    Subjective     CC: Asthmatic bronchitis    Interval History/24 hour events: cough and wheezing improved over night, no chills, back pain controlled    HPI/Subjective: see H&P, rest as above     Review of Systems:     As above rest negative    Physical Exam:     VITAL SIGNS PHYSICAL EXAM   Temp:  [97.2 F (36.2 C)-99 F (37.2 C)] 97.2 F (36.2 C)  Heart Rate:  [67-102] 87   Resp Rate:  [16-105] 16   BP: (112-165)/(60-89) 165/84 mmHg      Telemetry: sinus    No intake  or output data in the 24 hours ending 12/05/12 0831 Physical Exam  General: awake, alert X 3  Cardiovascular: regular rate and rhythm, no murmurs, rubs or gallops  Lungs: air entry bilaterally, with scattered  wheezing,  Few rhonchi, no rales  Abdomen: soft, non-tender, non-distended; no palpable masses,  normoactive bowel sounds  Extremities: no edema  CNS non focal   Vitals  were reviewed     Meds:     Medications were reviewed:  Current Facility-Administered Medications   Medication Dose Route Frequency   . albuterol  5 mg Nebulization Q4H SCH   . [COMPLETED] albuterol  7.5 mg Nebulization Once   . [COMPLETED] albuterol  7.5 mg Nebulization Once   . azithromycin  500 mg Intravenous QHS   . enoxaparin  40 mg Subcutaneous Daily   . fluticasone-salmeterol  2 puff Inhalation BID   . [COMPLETED] ipratropium  1 mg Nebulization Once   .  methylprednisolone  40 mg Intravenous Q8H SCH   . [COMPLETED] predniSONE  60 mg Oral Once   . [DISCONTINUED] methylprednisolone  60 mg Intravenous [facility]       Labs:   The following labs and test results were reviewed :  Labs (last 72 hours):      Lab 12/05/12 0346 12/04/12 1546   WBC 12.35* 12.23*   HGB 14.9 16.4   HCT 44.3 48.0   PLT 232 219       No results found for this basename: PT:2,INR:2,PTT:2 in the last 168 hours   Lab 12/05/12 0346 12/04/12 1546   NA 138 138   K 4.4 4.3   CL 107 106   CO2 20* 20*   BUN 13.0 10.0   CREAT 0.7 0.8   CA 8.9 9.6   ALB -- 4.0   PROT -- 7.4   BILITOTAL -- 0.6   ALKPHOS -- 90   ALT -- 35   AST -- 23   GLU 89 83                         Imaging, reviewed and are significant for:  Chest xray no infiltrate      Signed by: Duard Larsen, MD

## 2012-12-05 NOTE — Plan of Care (Signed)
Patient 02 level on room air is 96% on room air.  He has wheezy cough but states it is better than when admitted.  We have discussed that he has tesselon perales ordered for cough, but he does not want them now.   Bernie Covey, RN

## 2012-12-05 NOTE — Plan of Care (Signed)
Telemetry Sr hr 80s. vss and wnl. Pt is wheezing. Ned Treatments scheduled. Pt satting in high 90s on room air. Will continue to monitor.

## 2012-12-06 LAB — CBC AND DIFFERENTIAL
Basophils Absolute Automated: 0.01 (ref 0.00–0.20)
Basophils Automated: 0 %
Eosinophils Absolute Automated: 0.01 (ref 0.00–0.70)
Eosinophils Automated: 0 %
Hematocrit: 45.1 % (ref 42.0–52.0)
Hgb: 15.3 g/dL (ref 13.0–17.0)
Immature Granulocytes Absolute: 0.06 — ABNORMAL HIGH
Immature Granulocytes: 0 %
Lymphocytes Absolute Automated: 2 (ref 0.50–4.40)
Lymphocytes Automated: 15 %
MCH: 30.1 pg (ref 28.0–32.0)
MCHC: 33.9 g/dL (ref 32.0–36.0)
MCV: 88.6 fL (ref 80.0–100.0)
MPV: 9.3 fL — ABNORMAL LOW (ref 9.4–12.3)
Monocytes Absolute Automated: 0.56 (ref 0.00–1.20)
Monocytes: 4 %
Neutrophils Absolute: 10.99 — ABNORMAL HIGH (ref 1.80–8.10)
Neutrophils: 81 %
Nucleated RBC: 0 (ref 0–1)
Platelets: 218 (ref 140–400)
RBC: 5.09 (ref 4.70–6.00)
RDW: 13 % (ref 12–15)
WBC: 13.63 — ABNORMAL HIGH (ref 3.50–10.80)

## 2012-12-06 MED ORDER — ALBUTEROL SULFATE HFA 108 (90 BASE) MCG/ACT IN AERS
2.0000 | INHALATION_SPRAY | Freq: Four times a day (QID) | RESPIRATORY_TRACT | Status: AC | PRN
Start: 2012-12-06 — End: ?

## 2012-12-06 MED ORDER — PREDNISONE 20 MG PO TABS
ORAL_TABLET | ORAL | Status: DC
Start: 2012-12-06 — End: 2013-06-24

## 2012-12-06 MED ORDER — AZITHROMYCIN 250 MG PO TABS
250.0000 mg | ORAL_TABLET | Freq: Every day | ORAL | Status: DC
Start: 2012-12-06 — End: 2013-06-24

## 2012-12-06 MED ORDER — FLUTICASONE-SALMETEROL 115-21 MCG/ACT IN AERO
2.0000 | INHALATION_SPRAY | Freq: Two times a day (BID) | RESPIRATORY_TRACT | Status: DC
Start: 2012-12-06 — End: 2013-06-24

## 2012-12-06 NOTE — Discharge Summary (Signed)
Discharge Summary    Date:12/06/2012   Patient Name: [facility]  Attending Physician: No att. providers found    Date of Admission:   12/04/2012    Date of Discharge:   12/06/2012    Admitting Diagnosis:   Asthmatic Bronchitis    Discharge Dx:     Principal Diagnosis: Asthmatic bronchitis  Active Hospital Problems    Diagnosis POA   . Principal Problem: Asthmatic bronchitis Yes   . Chronic low back pain Yes     Chronic   . Obesity Yes     Chronic      Resolved Hospital Problems    Diagnosis POA   . Volume depletion Yes   . Wheezing without diagnosis of asthma Yes         Treatment Team:   Treatment Team:   Consulting Physician: Laretta Bolster, MD     Procedures performed:   Radiology: all results from this admission  Xr Chest 2 Views    12/04/2012   Airway inflammation.  Latanya Maudlin, MD  12/04/2012 4:48 PM       Hospital Course:   Reason for admission / HPI: wheezing and SOB    Hospital Course: Admitted with acute bronchitis wheezing improved with supportive treatment, iv steroids and duo nebs.  The wbc is stable and he has been afebrile, O2 adequate on RA and pain controlled on baseline medication.    Patient condition at discharge: stable  Today:  BP 144/74  Pulse 86  Temp 98.1 F (36.7 C) (Oral)  Resp 18  Ht 1.803 m (5\' 11" )  Wt 143.79 kg (317 lb)  BMI 44.23 kg/m2  SpO2 96%  Ranges for the last 24 hours:  Temp:  [96.9 F (36.1 C)-98.9 F (37.2 C)] 98.1 F (36.7 C)  Heart Rate:  [81-101] 86   Resp Rate:  [17-18] 18   BP: (119-144)/(56-74) 144/74 mmHg    Last set of labs     Lab 12/06/12 0308   WBC 13.63*   HGB 15.3   HCT 45.1   PLT 218       Lab 12/05/12 0346   NA 138   K 4.4   CL 107   CO2 20*   BUN 13.0   CREAT 0.7   EGFR >60.0   GLU 89   CA 8.9           Recommendations & Instructions for providers after discharge:  1.     Discharge Instructions:     Follow-up Information     Follow up with Aleen Campi, MD.        Follow up with Aleen Campi, MD. In 2 days.    Contact information:    [address]  [place] [address]  [phone]                Discharge Diet: Regular Diet        Activity/Weight bearing status: as tolerated  Disposition:  Home or Self Care     Discharge Medication List       As of 12/06/2012 11:22 AM      TAKE these medications           albuterol 108 (90 BASE) MCG/ACT inhaler    Dose: 2 puff    Commonly known as: PROVENTIL HFA;VENTOLIN HFA    Inhale 2 puffs into the lungs every 6 (six) hours as needed for Wheezing.        AMBIEN PO  Take by mouth.        azithromycin 250 MG tablet    Dose: 250 mg    Commonly known as: ZITHROMAX    Take 1 tablet (250 mg total) by mouth daily. For 5 days        fluticasone-salmeterol 115-21 MCG/ACT inhaler    Dose: 2 puff    Commonly known as: ADVAIR HFA    Inhale 2 puffs into the lungs 2 (two) times daily. For 14 days        oxyCODONE-acetaminophen 7.5-300 MG per tablet    Dose: 1 tablet    Commonly known as: LYNOX    Take 1 tablet by mouth every 4 (four) hours as needed.        predniSONE 20 MG tablet    Commonly known as: DELTASONE    2 po daily x 3 days then 1 1/2 po daily x 3 days then 1 for 3 days then d/c           Minutes spent coordinating discharge and reviewing discharge plan: 30 minutes      Signed by: Duard Larsen, MD

## 2012-12-06 NOTE — Discharge Instructions (Signed)
Diet: low fat    Activity:   No restriction.  activity as tolerated    Follow Up: Follow up as Directed.     Call MD for increase in tremor, sob, wheezing or Fever > 101.4

## 2012-12-06 NOTE — Progress Notes (Signed)
MEDICINE PROGRESS NOTE    Date Time: 12/06/2012 8:01 AM  Patient Name: Jeff Fuller  Attending Physician: Duard Larsen,*    Assessment:   Principal Problem:   *Asthmatic bronchitis improved with minimal wheezing this am, sick for 10 days with failure of outpatient treatment prior to arrival.  Active Problems:   Volume depletion resolved with fluids   Chronic low back pain stable   Obesity  Ex nicotine abuse quit 2 years ago              Plan:   1. Discharge today  Case discussed with: patient    Safety Checklist:     DVT prophylaxis:  CHEST guideline (See page [id number]) Chemical   Foley:  [place] [facility] Foley protocol Not present   IVs:  Peripheral IV   PT/OT: Not needed   Daily CBC & or Chem ordered:  SHM/ABIM guidelines (see #5) Will d/c as no longer needed   Reference for approximate charges of common labs: CBC auto diff - $76  BMP - $99  Mg - $79    Lines:     Active PICC Line / CVC Line / PIV Line / Drain / Airway / Intraosseous Line / Epidural Line / ART Line / Line / Wound / Pressure Ulcer / NG/OG Tube     Name   Placement date   Placement time   Site   Days    Peripheral IV 12/04/12 Right Antecubital  12/04/12   1604   Antecubital   less than 1           Disposition:     Today's date: 12/06/2012  Length of Stay: 2  Anticipated medical stability for discharge: May,  22 - Morning  Reason for ongoing hospitalization: none  Anticipated discharge needs: none    Subjective     CC: Asthmatic bronchitis    Interval History/24 hour events: cough and wheezing improved more over night, no chills, back pain controlled mild jitteriness yesterday now better    HPI/Subjective: see H&P, rest as above     Review of Systems:     As above rest negative    Physical Exam:     VITAL SIGNS PHYSICAL EXAM   Temp:  [96.9 F (36.1 C)-98.9 F (37.2 C)] 97.2 F (36.2 C)  Heart Rate:  [81-101] 91   Resp Rate:  [16-18] 17   BP: (119-165)/(56-84) 122/61 mmHg      Telemetry: sinus      Intake/Output Summary (Last 24 hours) at  12/06/12 0801  Last data filed at 12/05/12 1700   Gross per 24 hour   Intake    500 ml   Output    600 ml   Net   -100 ml    Physical Exam  General: awake, alert X 3  Cardiovascular: regular rate and rhythm, no murmurs, rubs or gallops  Lungs: air entry bilaterally, with few scattered  wheezing,  No rhonchi, no rales  Abdomen: soft, non-tender, non-distended; no palpable masses,  normoactive bowel sounds  Extremities: no edema  CNS non focal   Vitals  were reviewed     Meds:     Medications were reviewed:  Current Facility-Administered Medications   Medication Dose Route Frequency   . albuterol  5 mg Nebulization Q4H SCH   . azithromycin  500 mg Intravenous QHS   . enoxaparin  40 mg Subcutaneous Daily   . fluticasone-salmeterol  2 puff Inhalation BID   . methylprednisolone  40 mg  Intravenous Q8H SCH   . [DISCONTINUED] methylprednisolone  60 mg Intravenous [facility]       Labs:   The following labs and test results were reviewed :  Labs (last 72 hours):      Lab 12/06/12 0308 12/05/12 0346   WBC 13.63* 12.35*   HGB 15.3 14.9   HCT 45.1 44.3   PLT 218 232       No results found for this basename: PT:2,INR:2,PTT:2 in the last 168 hours   Lab 12/05/12 0346 12/04/12 1546   NA 138 138   K 4.4 4.3   CL 107 106   CO2 20* 20*   BUN 13.0 10.0   CREAT 0.7 0.8   CA 8.9 9.6   ALB -- 4.0   PROT -- 7.4   BILITOTAL -- 0.6   ALKPHOS -- 90   ALT -- 35   AST -- 23   GLU 89 83                         Imaging, reviewed and are significant for:  Chest xray no infiltrate      Signed by: Duard Larsen, MD

## 2012-12-06 NOTE — Progress Notes (Signed)
Jeff Fuller is ready for discharge. He is waiting for his wife to come to pick him up. Him and his wife live in a single family home in [place]. He has no concerns about returning home, but inquired whether he is clear to return to work. He works at the [employer] and this weekend is [employer] and his presence at work is mandatory. SW encouraged Jeff Fuller to rest today and tomorrow and just take it easy over the weekend.       Geryl Rankins, MSW  Clinical Social Worker  [place], [place]  [phone]

## 2012-12-06 NOTE — Progress Notes (Signed)
Discharge order placed this morning, discharge instructions reviewed with patient, discharge prescriptions given, questions answered, telemetry and saline lock IV discontinue, medications reconciliation done, no complaints noted. Patient awaiting for wife for transfer home.

## 2013-06-24 ENCOUNTER — Ambulatory Visit: Payer: BLUE CROSS/BLUE SHIELD

## 2013-06-24 NOTE — H&P (Signed)
Jeff Fuller is an 42 y.o. male with pilonidal cyst with abscess, previously incised and drained.    Past Medical History   Diagnosis Date   . Hyperlipidemia      borderline   . Bronchitis 5/14     requiring hospitalization; recovered   . Gastroesophageal reflux disease      mild int.   . Difficult intravenous access      pt states that he is an extremely difficult stick       Allergies:   Allergies   Allergen Reactions   . Celebrex (Celecoxib) Itching       Active Problems:   * No active hospital problems. *     Height 1.778 m (5\' 10" ), weight 136.079 kg (300 lb).    Review of Systems   All other systems reviewed and are negative.        Physical Exam   Constitutional: He is oriented to person, place, and time. He appears well-developed and well-nourished.   HENT:   Head: Normocephalic.   Left Ear: External ear normal.   Eyes: Conjunctivae normal are normal. Pupils are equal, round, and reactive to light.   Neck: Normal range of motion. Neck supple.   Cardiovascular: Normal rate, regular rhythm, normal heart sounds and intact distal pulses.    Pulmonary/Chest: Effort normal and breath sounds normal.   Abdominal: Soft. Bowel sounds are normal.   Neurological: He is alert and oriented to person, place, and time.       Assessment:  Pilonidal cyst with abscess    Plan:  Surgical excision and primary closure    Verneda Skill  06/24/2013

## 2013-06-24 NOTE — Pre-Procedure Instructions (Addendum)
EKG 12-04-2012 WDL in EPIC.  EKG ordered for day of surgery per anesthesia guidelines.

## 2013-06-24 NOTE — Pre-Procedure Instructions (Addendum)
Pre-op last week done at [facility]. No other pre-op required per pt; none required per anesthesia guidelines  Called [facility], spoke with Clifton Custard will fax labs from 06/17/13, number provided

## 2013-06-25 ENCOUNTER — Encounter
Admission: RE | Disposition: A | Discharge status: Home / Self Care | Payer: Self-pay | Source: Ambulatory Visit | Attending: Surgery

## 2013-06-25 ENCOUNTER — Ambulatory Visit: Payer: BLUE CROSS/BLUE SHIELD | Admitting: Surgery

## 2013-06-25 ENCOUNTER — Ambulatory Visit
Admission: RE | Admit: 2013-06-25 | Discharge: 2013-06-25 | Disposition: A | Discharge status: Home / Self Care | Payer: BLUE CROSS/BLUE SHIELD | Source: Ambulatory Visit | Attending: Surgery | Admitting: Surgery

## 2013-06-25 ENCOUNTER — Ambulatory Visit: Payer: Self-pay

## 2013-06-25 ENCOUNTER — Other Ambulatory Visit: Payer: Self-pay

## 2013-06-25 ENCOUNTER — Encounter: Payer: Self-pay | Admitting: Anesthesiology

## 2013-06-25 ENCOUNTER — Ambulatory Visit: Payer: BLUE CROSS/BLUE SHIELD | Admitting: Anesthesiology

## 2013-06-25 DIAGNOSIS — K219 Gastro-esophageal reflux disease without esophagitis: Secondary | ICD-10-CM | POA: Insufficient documentation

## 2013-06-25 DIAGNOSIS — L0501 Pilonidal cyst with abscess: Secondary | ICD-10-CM | POA: Insufficient documentation

## 2013-06-25 DIAGNOSIS — E785 Hyperlipidemia, unspecified: Secondary | ICD-10-CM | POA: Insufficient documentation

## 2013-06-25 HISTORY — PX: CYSTECTOMY, PILONIDAL: SHX3546

## 2013-06-25 SURGERY — CYSTECTOMY, PILONIDAL
Anesthesia: Anesthesia General | Site: Pelvis | Wound class: Contaminated

## 2013-06-25 MED ORDER — OXYCODONE-ACETAMINOPHEN 5-325 MG PO TABS
ORAL_TABLET | ORAL | 0 refills | Status: DC
Start: 2013-06-25 — End: 2017-11-02
  Filled 2013-06-25: qty 40, 5d supply, fill #0

## 2013-06-25 MED ORDER — ONDANSETRON HCL 4 MG/2ML IJ SOLN
4.0000 mg | Freq: Once | INTRAMUSCULAR | Status: DC | PRN
Start: 2013-06-25 — End: 2013-06-25

## 2013-06-25 MED ORDER — ALBUTEROL SULFATE HFA 108 (90 BASE) MCG/ACT IN AERS
INHALATION_SPRAY | RESPIRATORY_TRACT | Status: AC
Start: 2013-06-25 — End: ?
  Filled 2013-06-25: qty 1

## 2013-06-25 MED ORDER — FAMOTIDINE 20 MG/2ML IV SOLN
INTRAVENOUS | Status: AC
Start: 2013-06-25 — End: ?
  Filled 2013-06-25: qty 2

## 2013-06-25 MED ORDER — HYDROCODONE-ACETAMINOPHEN 5-325 MG PO TABS
1.0000 | ORAL_TABLET | ORAL | Status: DC | PRN
Start: 2013-06-25 — End: 2013-06-25

## 2013-06-25 MED ORDER — LEVOFLOXACIN 500 MG PO TABS
ORAL_TABLET | ORAL | 0 refills | Status: DC
Start: 2013-06-25 — End: 2013-06-25
  Filled 2013-06-25: qty 7, 7d supply, fill #0

## 2013-06-25 MED ORDER — OXYCODONE-ACETAMINOPHEN 5-325 MG PO TABS
ORAL_TABLET | ORAL | Status: AC
Start: 2013-06-25 — End: 2013-06-25
  Administered 2013-06-25: 2 via ORAL
  Filled 2013-06-25: qty 1

## 2013-06-25 MED ORDER — LEVOFLOXACIN IN D5W 500 MG/100ML IV SOLN
500.0000 mg | Freq: Every day | INTRAVENOUS | Status: DC
Start: 2013-06-25 — End: 2013-06-25

## 2013-06-25 MED ORDER — PROPOFOL INFUSION 10 MG/ML
INTRAVENOUS | Status: DC | PRN
Start: 2013-06-25 — End: 2013-06-25
  Administered 2013-06-25: 250 mg via INTRAVENOUS

## 2013-06-25 MED ORDER — BUPIVACAINE-EPINEPHRINE (PF) 0.5% -1:200000 IJ SOLN
INTRAMUSCULAR | Status: DC | PRN
Start: 2013-06-25 — End: 2013-06-25
  Administered 2013-06-25 (×2): 10 mL

## 2013-06-25 MED ORDER — MIDAZOLAM HCL 2 MG/2ML IJ SOLN
INTRAMUSCULAR | Status: AC
Start: 2013-06-25 — End: ?
  Filled 2013-06-25: qty 2

## 2013-06-25 MED ORDER — MEPERIDINE HCL 25 MG/ML IJ SOLN
12.5000 mg | INTRAMUSCULAR | Status: DC | PRN
Start: 2013-06-25 — End: 2013-06-25

## 2013-06-25 MED ORDER — OXYCODONE-ACETAMINOPHEN 5-325 MG PO TABS
ORAL_TABLET | ORAL | Status: DC
Start: 2013-06-25 — End: 2013-06-25
  Filled 2013-06-25: qty 1

## 2013-06-25 MED ORDER — GLYCOPYRROLATE 0.2 MG/ML IJ SOLN
INTRAMUSCULAR | Status: DC | PRN
Start: 2013-06-25 — End: 2013-06-25
  Administered 2013-06-25: 0.1 mg via INTRAVENOUS

## 2013-06-25 MED ORDER — FENTANYL CITRATE 0.05 MG/ML IJ SOLN
50.0000 ug | INTRAMUSCULAR | Status: DC | PRN
Start: 2013-06-25 — End: 2013-06-25

## 2013-06-25 MED ORDER — SUCCINYLCHOLINE CHLORIDE 20 MG/ML IJ SOLN
INTRAMUSCULAR | Status: DC | PRN
Start: 2013-06-25 — End: 2013-06-25
  Administered 2013-06-25: 160 mg via INTRAVENOUS

## 2013-06-25 MED ORDER — ONDANSETRON HCL 4 MG/2ML IJ SOLN
INTRAMUSCULAR | Status: DC | PRN
Start: 2013-06-25 — End: 2013-06-25
  Administered 2013-06-25: 4 mg via INTRAVENOUS

## 2013-06-25 MED ORDER — OXYCODONE-ACETAMINOPHEN 5-325 MG PO TABS
2.0000 | ORAL_TABLET | Freq: Once | ORAL | Status: AC
Start: 2013-06-25 — End: 2013-06-25

## 2013-06-25 MED ORDER — HYDROMORPHONE HCL PF 1 MG/ML IJ SOLN
0.5000 mg | INTRAMUSCULAR | Status: DC | PRN
Start: 2013-06-25 — End: 2013-06-25

## 2013-06-25 MED ORDER — MIDAZOLAM HCL 2 MG/2ML IJ SOLN
INTRAMUSCULAR | Status: DC | PRN
Start: 2013-06-25 — End: 2013-06-25
  Administered 2013-06-25: 2 mg via INTRAVENOUS

## 2013-06-25 MED ORDER — ALBUTEROL SULFATE HFA 108 (90 BASE) MCG/ACT IN AERS
INHALATION_SPRAY | RESPIRATORY_TRACT | Status: DC | PRN
Start: 2013-06-25 — End: 2013-06-25
  Administered 2013-06-25 (×2): 2 via RESPIRATORY_TRACT

## 2013-06-25 MED ORDER — PROPOFOL 10 MG/ML IV EMUL
INTRAVENOUS | Status: AC
Start: 2013-06-25 — End: ?
  Filled 2013-06-25: qty 20

## 2013-06-25 MED ORDER — LACTATED RINGERS IV SOLN
150.0000 mL/h | INTRAVENOUS | Status: DC
Start: 2013-06-25 — End: 2013-06-25
  Administered 2013-06-25: 500 mL/h via INTRAVENOUS

## 2013-06-25 MED ORDER — LIDOCAINE HCL (PF) 2 % IJ SOLN
INTRAMUSCULAR | Status: AC
Start: 2013-06-25 — End: ?
  Filled 2013-06-25: qty 5

## 2013-06-25 MED ORDER — FENTANYL CITRATE 0.05 MG/ML IJ SOLN
INTRAMUSCULAR | Status: DC | PRN
Start: 2013-06-25 — End: 2013-06-25
  Administered 2013-06-25: 100 ug via INTRAVENOUS

## 2013-06-25 MED ORDER — ONDANSETRON HCL 4 MG/2ML IJ SOLN
INTRAMUSCULAR | Status: AC
Start: 2013-06-25 — End: ?
  Filled 2013-06-25: qty 2

## 2013-06-25 MED ORDER — LIDOCAINE-EPINEPHRINE 1 %-1:100000 IJ SOLN
INTRAMUSCULAR | Status: DC | PRN
Start: 2013-06-25 — End: 2013-06-25
  Administered 2013-06-25 (×2): 10 mL

## 2013-06-25 MED ORDER — ROCURONIUM BROMIDE 50 MG/5ML IV SOLN
INTRAVENOUS | Status: DC | PRN
Start: 2013-06-25 — End: 2013-06-25
  Administered 2013-06-25: 5 mg via INTRAVENOUS

## 2013-06-25 MED ORDER — PROMETHAZINE HCL 25 MG/ML IJ SOLN
6.2500 mg | Freq: Once | INTRAMUSCULAR | Status: DC | PRN
Start: 2013-06-25 — End: 2013-06-25

## 2013-06-25 MED ORDER — LACTATED RINGERS IV SOLN
INTRAVENOUS | Status: DC
Start: 2013-06-25 — End: 2013-06-25
  Administered 2013-06-25: 1000 mL via INTRAVENOUS

## 2013-06-25 MED ORDER — FAMOTIDINE 10 MG/ML IV SOLN (WRAP)
INTRAVENOUS | Status: DC | PRN
Start: 2013-06-25 — End: 2013-06-25
  Administered 2013-06-25: 20 mg via INTRAVENOUS

## 2013-06-25 MED ORDER — LEVOFLOXACIN IN D5W 500 MG/100ML IV SOLN
INTRAVENOUS | Status: AC
Start: 2013-06-25 — End: 2013-06-25
  Administered 2013-06-25: 500 mg via INTRAVENOUS
  Filled 2013-06-25: qty 100

## 2013-06-25 MED ORDER — BACITRACIN ZINC 500 UNIT/GM EX OINT
TOPICAL_OINTMENT | CUTANEOUS | Status: DC | PRN
Start: 2013-06-25 — End: 2013-06-25
  Administered 2013-06-25: 10 g via TOPICAL

## 2013-06-25 MED ORDER — FENTANYL CITRATE 0.05 MG/ML IJ SOLN
INTRAMUSCULAR | Status: AC
Start: 2013-06-25 — End: ?
  Filled 2013-06-25: qty 2

## 2013-06-25 MED ORDER — METRONIDAZOLE IN NACL 500 MG/100 ML IV SOLN
INTRAVENOUS | Status: AC
Start: 2013-06-25 — End: 2013-06-25
  Administered 2013-06-25: 500 mg via INTRAVENOUS
  Filled 2013-06-25: qty 100

## 2013-06-25 MED ORDER — ROCURONIUM BROMIDE 50 MG/5ML IV SOLN
INTRAVENOUS | Status: AC
Start: 2013-06-25 — End: ?
  Filled 2013-06-25: qty 5

## 2013-06-25 MED ORDER — LIDOCAINE HCL 2 % IJ SOLN
INTRAMUSCULAR | Status: DC | PRN
Start: 2013-06-25 — End: 2013-06-25
  Administered 2013-06-25: 100 mg

## 2013-06-25 MED ORDER — METRONIDAZOLE IN NACL 500 MG/100 ML IV SOLN
500.0000 mg | Freq: Three times a day (TID) | INTRAVENOUS | Status: DC
Start: 2013-06-25 — End: 2013-06-25

## 2013-06-25 MED ORDER — LEVOFLOXACIN 500 MG PO TABS
500.0000 mg | ORAL_TABLET | Freq: Every day | ORAL | 0 refills | Status: DC
Start: 2013-06-25 — End: 2017-11-02
  Filled 2013-06-25: qty 7, 7d supply, fill #0

## 2013-06-25 MED ORDER — SUCCINYLCHOLINE CHLORIDE 20 MG/ML IJ SOLN
INTRAMUSCULAR | Status: AC
Start: 2013-06-25 — End: ?
  Filled 2013-06-25: qty 10

## 2013-06-25 MED ORDER — DEXAMETHASONE SODIUM PHOSPHATE 4 MG/ML IJ SOLN (WRAP)
INTRAMUSCULAR | Status: DC | PRN
Start: 2013-06-25 — End: 2013-06-25
  Administered 2013-06-25: 4 mg via INTRAVENOUS

## 2013-06-25 MED ORDER — GLYCOPYRROLATE 0.2 MG/ML IJ SOLN
INTRAMUSCULAR | Status: AC
Start: 2013-06-25 — End: ?
  Filled 2013-06-25: qty 2

## 2013-06-25 SURGICAL SUPPLY — 25 items
ADHESIVE LIQUID PUMP SPRAY BOTTLE (Prep) ×1 IMPLANT
ADHESIVE LIQUID PUMP SPRAY BOTTLE BENZOIN 4 OZ (Prep) ×1 IMPLANT
CONTAINER SPEC 8OZ NS SNPON LID TRNLU (Suction) ×2 IMPLANT
DRESSING TRANSPARENT L4 3/4 IN X W4 IN (Dressing) ×1 IMPLANT
DRESSING TRANSPARENT L4 3/4 IN X W4 IN POLYURETHANE ADHESIVE (Dressing) ×1 IMPLANT
DRESSING TRNS PU STD TGDRM 4.75X4IN LF (Dressing) ×2
GAUZE SPONGE VERSALON 4PLY 2X2 (Dressing) ×2 IMPLANT
GLOVE SURG BIOGEL PF LTX SZ7.0 (Glove) ×4 IMPLANT
GOWN OPTIMA STRL BACK OR (Gown) ×2 IMPLANT
KIT MAJOR EAR FFX (Kits) ×2 IMPLANT
PACKING WND IFRM CTTN GZE CRTY 5YDX.25IN (Packing) ×2 IMPLANT
PAD ELECTROSRG GRND REM W CRD (Procedure Accessories) ×2 IMPLANT
SLEEVE CMPR NYL LG KN LGTH SCD EXP LF NS (Procedure Accessories) ×2 IMPLANT
SLEEVE COMPRESSION NYLON LARGE KNEE LENGTH KENDALL ADJUSTABLE (Procedure Accessories) ×1 IMPLANT
SOL BETADINE SOLUTION 4 OZ (Prep) ×2 IMPLANT
SOLUTION IRR 0.9% NACL 1000ML LF STRL (Irrigation Solutions) ×2
SOLUTION IRRIGATION 0.9% SODIUM CHLORIDE (Irrigation Solutions) ×1 IMPLANT
SOLUTION IRRIGATION 0.9% SODIUM CHLORIDE 1000 ML PLASTIC POUR BOTTLE (Irrigation Solutions) ×1 IMPLANT
SPRAY BENOZOIN 3.5 OZ (Prep) ×2
SUTURE ETHILON 3-0 30IN PSLX (Suture) ×2 IMPLANT
SUTURE ETHILON BLACK 3-0 PS-1 L18 IN (Suture) ×2 IMPLANT
SUTURE ETHILON BLACK 3-0 PS-1 L18 IN MONOFILAMENT NONABSORBABLE (Suture) ×2 IMPLANT
SUTURE NABSB 3-0 PS1 ETH MTPS 18IN MFL (Suture) ×4
TAPE SURGICAL DURAPORE 3IN (Tape) ×2 IMPLANT
TRAY SKIN DRY SCRUB (Tray) ×2 IMPLANT

## 2013-06-25 NOTE — Transfer of Care (Signed)
Anesthesia Transfer of Care Note    Patient: Jeff Fuller    Procedures performed: Procedure(s) with comments:  CYSTECTOMY, PILONIDAL - CYSTECTOMY, PILONIDAL    Anesthesia type: General ETT    Patient location:Phase I PACU      Last vitals:   Filed Vitals:    06/25/13 1510   BP: 150/65   Pulse: 89   Temp:    Resp: 12   SpO2: 99%       Post pain: Patient not complaining of pain, continue current therapy      Mental Status:awake    Respiratory Function: tolerating face mask    Cardiovascular: stable    Nausea/Vomiting: patient not complaining of nausea or vomiting    Hydration Status: adequate    Post assessment: no apparent anesthetic complications

## 2013-06-25 NOTE — Brief Op Note (Signed)
BRIEF OP NOTE    Date Time: 06/25/2013 2:58 PM    Patient Name:   Jeff Fuller    Date of Operation:   06/25/2013    Providers Performing:   Surgeon(s):  Verneda Skill, MD    Assistant (s):  [place], [facility]  Operative Procedure:   Procedure(s):  CYSTECTOMY, PILONIDAL    Preoperative Diagnosis:   Pre-Op Diagnosis Codes:     * Pilonidal cyst without mention of abscess [685.1]    Postoperative Diagnosis:   * No post-op diagnosis entered *    Anesthesia:   General    Estimated Blood Loss:        Specimens:        SPECIMENS (last 24 hours)      Pathology Specimens     Row Name 06/25/13 1432             Specimen Information    Specimen Testing Required Routine Pathology     Specimen ID  [id number]     Specimen Description Pilonidal Cyst         Findings:   See my dictation    Complications:   none      Signed by: Verneda Skill, MD                                                                              [facility] OR

## 2013-06-25 NOTE — Anesthesia Preprocedure Evaluation (Signed)
Anesthesia Evaluation    AIRWAY    Mallampati: III    TM distance: >3 FB  Neck ROM: full  Mouth Opening:full   CARDIOVASCULAR           DENTAL         PULMONARY         OTHER FINDINGS                      Anesthesia Plan    ASA 3     general                                 informed consent obtained

## 2013-06-25 NOTE — Anesthesia Postprocedure Evaluation (Signed)
Anesthesia Post Evaluation    Patient: Jeff Fuller    Procedures performed: Procedure(s) with comments:  CYSTECTOMY, PILONIDAL - CYSTECTOMY, PILONIDAL    Anesthesia type: General ETT    Patient location:Phase I PACU    Last vitals:   Filed Vitals:    06/25/13 1510   BP: 150/65   Pulse: 89   Temp:    Resp: 12   SpO2: 99%       Post pain: Patient not complaining of pain, continue current therapy      Mental Status:awake    Respiratory Function: tolerating room air    Cardiovascular: stable    Nausea/Vomiting: patient not complaining of nausea or vomiting    Hydration Status: adequate    Post assessment: no apparent anesthetic complications

## 2013-06-25 NOTE — OR PreOp (Signed)
Per Dr. Sherril Croon, EKG from May 2014 sufficient, do not need new EKG today.

## 2013-06-25 NOTE — Op Note (Signed)
Procedure Date: 06/25/2013     Patient Type: A     SURGEON: Verneda Skill MD  ASSISTANT:       PREOPERATIVE DIAGNOSIS:  Pilonidal cyst and abscess.     POSTOPERATIVE DIAGNOSIS:  Pilonidal cyst and abscess.     TITLE OF PROCEDURE:  Wide and complete excision of pilonidal cysts.     ANESTHESIA:  General endotracheal.     INDICATIONS:  This is a 42 year old male with a history of chronic pilonidal infection  and subsequent flareup causing a pilonidal abscess which was incised and  drained in the office.  A wide excision to prevent the recurrence was  recommended and discussed with the patient and informed consent was  obtained prior to the procedure.     DESCRIPTION OF PROCEDURE:  The patient was placed on the operating room table in supine position after  all required preoperative procedures were completed.  Then, after the  induction of adequate general endotracheal anesthesia, the patient was  repositioned in the prone position with adequate padding.  Then buttocks  and lower lumbar area was shaved and prepped and draped in usual sterile  fashion.  After use of local anesthetic, elliptical incision was made to  include the 2 sinus tract and underlying cyst and previously incised and  drained abscess cavity was made.  Care was taken to extend the right side  of the flap approximately 2 cm off the midline.  Then the subcutaneous  tissue was sharply excised and care was taken not to enter into the cyst  cavity nor the granulation tissue.  After complete excision, this was sent  to pathology.  Adequate hemostasis was achieved.  Then the left side of the  flap was undermined to advance to the right side to create a flatter  crease.  After this was done, the 15 round [place] Mons was brought through a  separate stab wound and placed into the subsequent cavity.  Then incision  was closed with a 3-0 Vicryl suture in the subcutaneous layer and a 3-0  nylon suture in interrupted fashion across the incision.  The patient  tolerated  the procedure well and was extubated in the operating room.  He  was transferred to recovery room in satisfactory condition.  Estimated  blood loss was less than 50 mL and the counts were correct at the end of  the procedure.  There were no intraoperative complications.           D:  06/25/2013 16:49 PM by Dr. Verneda Skill, MD [phone])  T:  06/25/2013 21:58 PM by [id number]      (Conf: [id number]) (Doc ID: [id number])

## 2013-06-25 NOTE — PACU (Signed)
..  Pt meets criteria to be discharged home.  Pt VSS, wound WNL, A/Ox4, follows commands, pain WNL, POSS 1, pt tolerating po fluids and food, discharge instructions discussed with patient and responsible adult/party.  Last set of vitals taken, copy of instructions given to family, pt ambulated, clothes changed, pt to wheelchair, IV discontinued, pt sent with all valuables at bedside/labeled in bag.  Pt wheeled to lobby and into car.  Pt discharged home.

## 2013-06-25 NOTE — Discharge Instructions (Addendum)
Wound care:  Keep the surgical area dry and clean for 24 hours then may remove the dressing and shower with drain in.  Reapply antibiotic ointment daily    Record drainage daily.    Take antibiotic(levaquin) for 7 days.    Pain Medication:   May use over the counter medications, Motrin , Advil, Aleve.   Take prescription medication with some food to avoid upset stomach.   Take stool softner or laxitive while taking narcotic pain medications.     Activities:  As tolerated.   Avoid heavy lifting or straining till pain free.   Avoid driving while taking narcotic pain medications.     Follow up:   Call [phone] with any questions.              Post Anesthesia Discharge Instructions    Although you may be awake and alert in the recovery room, small amounts of anesthetic remain in your system for about 24 hours.  You may feel tired and sleepy during this time.      You are advised to go directly home from the hospital.    Plan to stay at home and rest for the remainder of the day.    It is advisable to have someone with you at home for 24 hours after surgery.    Do not operate a motor vehicle, or any mechanical or electrical equipment for the next 24 hours.      Be careful when you are walking around, you may become dizzy.  The effects of anesthesia and/or medications are still present and drowsiness may occur    Do not consume alcohol, tranquilizers, sleeping medications, or any other non prescribed medication for the remainder of the day.    Diet:  begin with liquids, progress your diet as tolerated or as directed by your surgeon.  Nausea and vomiting may occur in the next 24 hours.

## 2013-06-26 ENCOUNTER — Encounter: Payer: Self-pay | Admitting: Surgery

## 2013-06-27 LAB — LAB USE ONLY - HISTORICAL SURGICAL PATHOLOGY

## 2013-12-18 ENCOUNTER — Emergency Department
Admission: EM | Admit: 2013-12-18 | Discharge: 2013-12-18 | Disposition: A | Discharge status: Home / Self Care | Payer: BLUE CROSS/BLUE SHIELD | Attending: Emergency Medicine | Admitting: Emergency Medicine

## 2013-12-18 ENCOUNTER — Other Ambulatory Visit: Payer: Self-pay

## 2013-12-18 ENCOUNTER — Emergency Department: Payer: BLUE CROSS/BLUE SHIELD

## 2013-12-18 DIAGNOSIS — L03221 Cellulitis of neck: Secondary | ICD-10-CM | POA: Insufficient documentation

## 2013-12-18 DIAGNOSIS — K219 Gastro-esophageal reflux disease without esophagitis: Secondary | ICD-10-CM | POA: Insufficient documentation

## 2013-12-18 DIAGNOSIS — L0291 Cutaneous abscess, unspecified: Secondary | ICD-10-CM

## 2013-12-18 DIAGNOSIS — L0211 Cutaneous abscess of neck: Secondary | ICD-10-CM | POA: Insufficient documentation

## 2013-12-18 MED ORDER — CLINDAMYCIN HCL 300 MG PO CAPS
300.0000 mg | ORAL_CAPSULE | Freq: Four times a day (QID) | ORAL | 0 refills | Status: AC
Start: 2013-12-18 — End: 2013-12-25
  Filled 2013-12-18: qty 28, 7d supply, fill #0

## 2013-12-18 NOTE — ED Notes (Signed)
Eval for spider bite

## 2013-12-18 NOTE — Discharge Instructions (Signed)
Abscess    You were diagnosed with an abscess of the:   Skin.    An abscess is a sore that is infected and filled with pus. It is caused by an infection with bacteria. Sometimes a splinter or other object stuck in the skin can cause an infection that can become an abscess. The body traps the infection in a tight pocket to try to stop the infection from spreading to other areas. The usual treatment is to make a cut in the abscess so the pus can drain out. Most abscesses heal quickly with no need for antibiotics.    Your doctor has determined that antibiotics ARE necessary to treat your abscess. Fill the prescription and take all medications as prescribed until they are all gone.    A drain and/or packing have been placed in your abscess to help the wound heal. The packing in your wound will need to be changed. This can be done by your family doctor, a referral doctor, this facility or the nearest Emergency Department. Your doctor will set up a plan for you to have the packing changed. Leave the drain and packing in place until you see a doctor. The drain might fall out on its own. If this happens, cover the abscess with a clean dressing (bandage) and follow up with a doctor as scheduled. Until the packing is removed, don't soak the wound in water, like in a bathtub or pool. Short showers or sponge baths are okay. Keep the wound as dry and clean as possible.    YOU SHOULD SEEK MEDICAL ATTENTION IMMEDIATELY, EITHER HERE OR AT THE NEAREST EMERGENCY DEPARTMENT, IF ANY OF THE FOLLOWING OCCURS:   You see unusual redness or swelling.   You see red streaks on the arm or leg.   The wound or drainage smells bad.   You have fever (temperature higher than 100.4F / 38C), chills, worse pain or swelling.

## 2013-12-18 NOTE — ED Provider Notes (Signed)
Physician/Midlevel provider first contact with patient: 12/18/13 1403         History     Chief Complaint   Patient presents with   . Abscess     HPI     43 y.o. M c/o suspected spider bite on anterior neck. Blister formed three days ago, improved yesterday and worsened today prompting visit to pt's PCP Dr. Valerie Salts. Dr. Valerie Salts attempted to pop the fluid collection w/o success. Dr. Valerie Salts d/w pt's surgeon Dr. Curley Spice who stated patient should come to the ER for further evaluation. Pt reports hx of abscesses. Pain is mild (3/10).        Past Medical History   Diagnosis Date   . Hyperlipidemia      borderline   . Bronchitis 5/14     requiring hospitalization; recovered   . Gastroesophageal reflux disease      mild int.   . Difficult intravenous access      pt states that he is an extremely difficult stick       Past Surgical History   Procedure Laterality Date   . Total hip arthroplasty  15 yrs     right hip   . Cystectomy  2004     pilondial of back   . Cystectomy, pilonidal  06/25/2013     Procedure: CYSTECTOMY, PILONIDAL;  Surgeon: Verneda Skill, MD;  Location: [facility] OR;  Service: General;  Laterality: N/A;  CYSTECTOMY, PILONIDAL       No family history on file.    Social  History   Substance Use Topics   . Smoking status: Current Some Day Smoker   . Smokeless tobacco: Not on file      Comment: socially (once every 2 months; was heavy smoker x15 yrs 1ppd   . Alcohol Use: Yes      Comment: social        .     Allergies   Allergen Reactions   . Celebrex [Celecoxib] Itching       Discharge Medication List as of 12/18/2013  2:17 PM      CONTINUE these medications which have NOT CHANGED    Details   albuterol (PROVENTIL HFA;VENTOLIN HFA) 108 (90 BASE) MCG/ACT inhaler Inhale 2 puffs into the lungs every 6 (six) hours as needed for Wheezing., Starting 12/06/2012, Until Discontinued, Print      levofloxacin (LEVAQUIN) 500 MG tablet Take 1 tablet (500 mg total) by mouth daily., Starting 06/25/2013, Until Discontinued,  Normal      naproxen (NAPROSYN) 500 MG tablet Take 500 mg by mouth as needed., Until Discontinued, Historical Med      oxyCODONE-acetaminophen (LYNOX) 7.5-300 MG per tablet Take 1 tablet by mouth every 8 (eight) hours as needed. , Until Discontinued, Historical Med      Testosterone (ANDROGEL) 20.25 MG/1.25GM (1.62%) Gel Place onto the skin daily., Until Discontinued, Historical Med      Zolpidem Tartrate (AMBIEN PO) Take 10 mg by mouth nightly as needed. , Until Discontinued, Historical Med              Review of Systems   ROS: all pertinent positive and negatives as per the HPI. All other systems reviewed and are negative.       Physical Exam    BP: 167/98 mmHg, Heart Rate: 80, Temp: 98.8 F (37.1 C), Resp Rate: 15, SpO2: 96 %, Weight: 133.811 kg    Physical Exam   Constitutional: He is oriented to person, place, and time. He  appears well-developed and well-nourished. No distress.   NAD  BP 167/98 - d/w patient + PCP   HENT:   Head: Normocephalic and atraumatic.   Eyes: Pupils are equal, round, and reactive to light.   Neck: Normal range of motion. Neck supple.   Small 1.5 cm in diameter area of induration w/ central scab (c/w previously draining abscess).    Minimal erythema around site. Anterior neck R lateral 3 cm lateral from thyroid cartilage    U/S shows no underlying fluid collection.   Cardiovascular: Normal rate and regular rhythm.    Pulmonary/Chest: Effort normal and breath sounds normal.   Abdominal: Soft. There is no tenderness.   Musculoskeletal: Normal range of motion.   Neurological: He is alert and oriented to person, place, and time.   Skin: Skin is warm and dry. He is not diaphoretic.   See above   Psychiatric: He has a normal mood and affect.   Nursing note and vitals reviewed.      MDM and ED Course     ED Medication Orders    None           MDM  Number of Diagnoses or Management Options  Abscess:   Diagnosis management comments: Medical Decision Making      Presumptive Diagnosis: abscess  -draining, cellulitis    Treatment Plan: home, see patient instructions for treatment and plan    I reviewed the vital signs, nursing notes, past medical history, past surgical history, family history and social history.  No att. providers found    Vital Signs - BP 167/98   Pulse 80   Temp(Src) 98.8 F (37.1 C)   Resp 15   Ht 1.753 m   Wt 133.811 kg   BMI 43.54 kg/m2     SpO2 96%     Pulse Oximetry Analysis -  Normal    Differential Diagnosis (not completely inclusive): abscess, cellulitis    Laboratory results reviewed by EDP: No    Radiologic study results reviewed by EDP: No    Radiologic Studies Interpreted (viewed) by EDP: No          2:14 PM D/w Dr. Valerie Salts, will see patient in the office tomorrow for recheck. Agrees with plan.     Procedures    Clinical Impression & Disposition     Clinical Impression  Final diagnoses:   Abscess        ED Disposition    Discharge Yong Grieser discharge to home/self care.    Condition at disposition: Stable             Discharge Medication List as of 12/18/2013  2:17 PM      START taking these medications    Details   clindamycin (CLEOCIN) 300 MG capsule Take 1 capsule (300 mg total) by mouth 4 (four) times daily., Starting 12/18/2013, Until Wed 12/25/13, Normal              Treatment Team: Scribe: Lawson, [place]    I was acting as a Neurosurgeon for Olevia Bowens, MD on [place]  Treatment Team: Scribe: Lawson, [place]   I am the first provider for this patient and I personally performed the services documented. Treatment Team: Scribe: Lawson, [place] is scribing for me on Chain,[place]. This note accurately reflects work and decisions made by me.  Olevia Bowens, MD       Olevia Bowens, MD  12/22/13 [phone]

## 2015-01-18 ENCOUNTER — Emergency Department: Payer: Self-pay

## 2015-01-18 ENCOUNTER — Emergency Department
Admission: EM | Admit: 2015-01-18 | Discharge: 2015-01-19 | Disposition: A | Discharge status: Home / Self Care | Payer: Self-pay | Attending: Emergency Medicine | Admitting: Emergency Medicine

## 2015-01-18 DIAGNOSIS — K219 Gastro-esophageal reflux disease without esophagitis: Secondary | ICD-10-CM | POA: Insufficient documentation

## 2015-01-18 DIAGNOSIS — S93401A Sprain of unspecified ligament of right ankle, initial encounter: Secondary | ICD-10-CM | POA: Insufficient documentation

## 2015-01-18 DIAGNOSIS — Z87891 Personal history of nicotine dependence: Secondary | ICD-10-CM | POA: Insufficient documentation

## 2015-01-18 DIAGNOSIS — S93601A Unspecified sprain of right foot, initial encounter: Secondary | ICD-10-CM | POA: Insufficient documentation

## 2015-01-18 DIAGNOSIS — I10 Essential (primary) hypertension: Secondary | ICD-10-CM

## 2015-01-18 MED ORDER — HYDROMORPHONE HCL 1 MG/ML IJ SOLN
1.0000 mg | Freq: Once | INTRAMUSCULAR | Status: AC
Start: 2015-01-18 — End: 2015-01-18
  Administered 2015-01-18: 1 mg via INTRAMUSCULAR
  Filled 2015-01-18: qty 1

## 2015-01-18 MED ORDER — ONDANSETRON 4 MG PO TBDP
4.0000 mg | ORAL_TABLET | Freq: Four times a day (QID) | ORAL | Status: AC | PRN
Start: 2015-01-18 — End: ?

## 2015-01-18 MED ORDER — ONDANSETRON 4 MG PO TBDP
4.0000 mg | ORAL_TABLET | Freq: Once | ORAL | Status: AC
Start: 2015-01-18 — End: 2015-01-18
  Administered 2015-01-18: 4 mg via ORAL
  Filled 2015-01-18: qty 1

## 2015-01-18 MED ORDER — OXYCODONE-ACETAMINOPHEN 5-325 MG PO TABS
ORAL_TABLET | ORAL | Status: DC
Start: 2015-01-18 — End: 2017-11-02
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
## 2015-01-19 NOTE — Discharge Instructions (Signed)
Dear Mr.  Jeff Fuller:    Thank you for choosing the [facility] for your healthcare needs.  I hope your visit today was EXCELLENT.    Please see Dr. Jim Desanctis or another orthopedist in 3-5 days    See Dr. Valerie Salts for a blood pressure recheck in 2 days (today 160/101)    Below is some information that our patients often find helpful.  Information on obtaining a primary care physician is below.     We wish you good health and please do not hesitate to contact us if we can ever be of any assistance.    Sincerely,  Netta Neat, MD  [facility]    ____________________________________________      Thank you for choosing [facility] for your emergency care needs.  We strive to provide EXCELLENT care to you and your family.      If you do not continue to improve or your condition worsens, please contact your doctor or return immediately to the Emergency Department.    ONSITE PHARMACY  Our full service onsite pharmacy is a 2 minute walk from the ER.  Open Mon to Fri from 8 am to 8 pm, Sat  9 am to 5 pm. Ask an ED staff member for directions.  We accept all major insurances and prices are competitive with major retailers.  Ask your provider to print your prescriptions down to the pharmacy to speed you on your way home.    OBTAINING A PRIMARY CARE APPOINTMENT    Primary care physicians (PCPs, also known as primary care doctors) are either internists or family medicine doctors. Both types of PCPs focus on health promotion, disease prevention, patient education and counseling, and treatment of acute and chronic medical conditions.    Call for an appointment with a primary care doctor.  Ask to see who is taking new patients. Two options are below.    Both groups have offices near [place] and in the [place] area.    [facility]  telephone:  [phone]  [email or web address]    [facility]  telephone:   [phone]  [email or web address]      DOCTOR REFERRALS  Call [phone] (available 24 hours a day, 7 days a week) if you need any further referrals and we can help you find a primary care doctor or specialist.  Also, available online at:  [email or web address]      YOUR CONTACT INFORMATION  Before leaving please check with registration to make sure we have an up-to-date contact number.  You can call registration at [phone] to update your information.  For questions about your hospital bill, please call [phone].  For questions about your Emergency Dept Physician bill please call [phone].      FREE HEALTH SERVICES  If you need help with health or social services, please call 2-1-1 for a free referral to resources in your area.  2-1-1 is a free service connecting people with information on health insurance, free clinics, pregnancy, mental health, dental care, food assistance, housing, and substance abuse counseling.  Also, available online at:  http://www.[place].org    MEDICAL RECORDS AND TESTS  Certain laboratory test results do not come back the same day, for example urine cultures.   We will contact you if other important findings are noted.  Radiology films are often reviewed again to ensure accuracy.  If there is any discrepancy, we will notify you.  Please call [phone] to pick up a complimentary CD of any radiology studies performed.  If you or your doctor would like to request a copy of your medical records, please call [phone].      ORTHOPEDIC INJURY   Please know that significant injuries can exist even when an initial x-ray is read as normal or negative.  This can occur because some fractures (broken bones) are not initially visible on x-rays.  For this reason, close outpatient follow-up with your primary care doctor or bone specialist (orthopedist) is required.    MEDICATIONS AND FOLLOWUP  Please be aware that some prescription  medications can cause drowsiness.  Use caution when driving or operating machinery.    The examination and treatment you have received in our Emergency Department is provided on an emergency basis, and is not intended to be a substitute for your primary care physician.  It is important that your doctor checks you again and that you report any new or remaining problems at that time.      [place], [place], [place] [address] (1.4 miles, 7 minutes)  [place], [place], [place] [address] (6.5 miles, 13 minutes)  Handout with directions available on request        [place]  [facility])  Call to start or finish an application, compare plans, enroll or ask a question.  [place]: [phone]  Web:  Healthcare.gov    Help Enrolling in [place]  [phone])[phone] (TOLL-FREE)  (470)[phone] (TTY)  Web:  Http://www.coverva.org    Local Help Enrolling in the [place]  939-269-8277 (MAIN)  Email:  health-help@nvfs .org  Web:  http://lewis-perez.info/  Address:  [address], [address] [place], [place] [address]
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
## 2015-04-12 ENCOUNTER — Emergency Department (HOSPITAL_COMMUNITY)
Admission: EM | Admit: 2015-04-12 | Discharge: 2015-04-12 | Disposition: A | Discharge status: Home / Self Care | Payer: Self-pay | Attending: Emergency Medicine | Admitting: Emergency Medicine

## 2015-04-12 ENCOUNTER — Emergency Department (HOSPITAL_COMMUNITY): Payer: Self-pay

## 2015-04-12 ENCOUNTER — Encounter (HOSPITAL_COMMUNITY): Payer: Self-pay | Admitting: Emergency Medicine

## 2015-04-12 DIAGNOSIS — Y998 Other external cause status: Secondary | ICD-10-CM | POA: Insufficient documentation

## 2015-04-12 DIAGNOSIS — Z791 Long term (current) use of non-steroidal anti-inflammatories (NSAID): Secondary | ICD-10-CM | POA: Insufficient documentation

## 2015-04-12 DIAGNOSIS — Y9355 Activity, bike riding: Secondary | ICD-10-CM | POA: Insufficient documentation

## 2015-04-12 DIAGNOSIS — S8992XA Unspecified injury of left lower leg, initial encounter: Secondary | ICD-10-CM | POA: Insufficient documentation

## 2015-04-12 DIAGNOSIS — M25562 Pain in left knee: Secondary | ICD-10-CM

## 2015-04-12 DIAGNOSIS — Y92410 Unspecified street and highway as the place of occurrence of the external cause: Secondary | ICD-10-CM | POA: Insufficient documentation

## 2015-04-12 MED ORDER — OXYCODONE-ACETAMINOPHEN 5-325 MG PO TABS: 2.0000 | ORAL_TABLET | Freq: Four times a day (QID) | ORAL | Status: DC | PRN

## 2015-04-12 NOTE — Discharge Instructions (Signed)
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
## 2015-04-12 NOTE — ED Notes (Signed)
PA at bedside.

## 2015-04-12 NOTE — ED Notes (Signed)
Ortho [facility] has been called to place immobilizer and crutches.

## 2015-04-12 NOTE — ED Notes (Addendum)
Pain medication offered however, patient is driving self. Motrin was offered, he declines. Pt reports he will get his prescription filled. He thanks this RN for asking. Ortho at bedside.
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
## 2015-11-27 ENCOUNTER — Ambulatory Visit (INDEPENDENT_AMBULATORY_CARE_PROVIDER_SITE_OTHER): Payer: Self-pay | Admitting: Family Medicine

## 2015-11-27 VITALS — BP 118/80 | HR 86 | Temp 98.5°F | Resp 18 | Ht 70.0 in | Wt 317.4 lb

## 2015-11-27 DIAGNOSIS — K047 Periapical abscess without sinus: Secondary | ICD-10-CM

## 2015-11-27 MED ORDER — CLINDAMYCIN HCL 300 MG PO CAPS: ORAL_CAPSULE | ORAL | Status: DC

## 2015-11-27 NOTE — Patient Instructions (Addendum)
     IF you received an x-ray today, you will receive an invoice from [facility]. Please contact [facility] at [phone] with questions or concerns regarding your invoice.   IF you received labwork today, you will receive an invoice from [facility]. Please contact [facility] at [phone] with questions or concerns regarding your invoice.   Our billing staff will not be able to assist you with questions regarding bills from these companies.  You will be contacted with the lab results as soon as they are available. The fastest way to get your results is to activate your My Chart account. Instructions are located on the last page of this paperwork. If you have not heard from us regarding the results in 2 weeks, please contact this office.    Dental Abscess A dental abscess is a collection of pus in or around a tooth. CAUSES This condition is caused by a bacterial infection around the root of the tooth that involves the inner part of the tooth (pulp). It may result from:  Severe tooth decay.  Trauma to the tooth that allows bacteria to enter into the pulp, such as a broken or chipped tooth.  Severe gum disease around a tooth. SYMPTOMS Symptoms of this condition include:  Severe pain in and around the infected tooth.  Swelling and redness around the infected tooth, in the mouth, or in the face.  Tenderness.  Pus drainage.  Bad breath.  Bitter taste in the mouth.  Difficulty swallowing.  Difficulty opening the mouth.  Nausea.  Vomiting.  Chills.  Swollen neck glands.  Fever. DIAGNOSIS This condition is diagnosed with examination of the infected tooth. During the exam, your dentist may tap on the infected tooth. Your dentist will also ask about your medical and dental history and may order X-rays. TREATMENT This condition is treated by eliminating the infection. This may be done with:  Antibiotic medicine.  A root  canal. This may be performed to save the tooth.  Pulling (extracting) the tooth. This may also involve draining the abscess. This is done if the tooth cannot be saved. HOME CARE INSTRUCTIONS  Take medicines only as directed by your dentist.  If you were prescribed antibiotic medicine, finish all of it even if you start to feel better.  Rinse your mouth (gargle) often with salt water to relieve pain or swelling.  Do not drive or operate heavy machinery while taking pain medicine.  Do not apply heat to the outside of your mouth.  Keep all follow-up visits as directed by your dentist. This is important. SEEK MEDICAL CARE IF:  Your pain is worse and is not helped by medicine. SEEK IMMEDIATE MEDICAL CARE IF:  You have a fever or chills.  Your symptoms suddenly get worse.  You have a very bad headache.  You have problems breathing or swallowing.  You have trouble opening your mouth.  You have swelling in your neck or around your eye.   This information is not intended to replace advice given to you by your health care provider. Make sure you discuss any questions you have with your health care provider.   Document Released: 07/04/2005 Document Revised: 11/18/2014 Document Reviewed: 07/01/2014 Elsevier Interactive Patient Education 2016 [employer]  

## 2015-11-27 NOTE — Progress Notes (Signed)
   Subjective:    Patient ID: Maxwell Ellis, male    DOB: 08/27/1970, 45 y.o.   MRN: [id number]  HPI This is a 45 yo male who presents today with tooth pain that radiates up into his face. He knocked a tooth out several years ago that has bothered him intermittently. [place] to [place] and was seen by Dr. Providence LaniusHowell who referred him to a prosthetic dentist. He started to have pain and swelling yesterday and tried to get in with a dentist today and was unsuccessful. Took some tylenol without improvement. Took one of his wife's Xanax last night to help him sleep. He has tried some acetaminophen with little relief.  He is requesting pain medication.   Past Medical History  Diagnosis Date  . Insomnia    Past Surgical History  Procedure Laterality Date  . Total hip arthroplasty Left    History reviewed. No pertinent family history. Social History  Substance Use Topics  . Smoking status: Former Games developermoker  . Smokeless tobacco: None  . Alcohol Use: No    Review of Systems No fever or chills, no nausea or vomiting. No drainage. No bad taste in mouth.     Objective:   Physical Exam  Constitutional: He is oriented to person, place, and time. He appears well-developed and well-nourished. No distress.  Morbidly obese.   HENT:  Head: Normocephalic and atraumatic.  Mouth/Throat:    Eyes: Conjunctivae are normal.  Neck: Normal range of motion. Neck supple.  Cardiovascular: Normal rate.   Pulmonary/Chest: Effort normal.  Lymphadenopathy:    He has no cervical adenopathy.  Neurological: He is alert and oriented to person, place, and time.  Skin: Skin is warm and dry. He is not diaphoretic.  Psychiatric: He has a normal mood and affect. His behavior is normal. Judgment and thought content normal.  Vitals reviewed.     BP 118/80 mmHg  Pulse 86  Temp(Src) 98.5 F (36.9 C) (Oral)  Resp 18  Ht 5\' 10"  (1.778 m)  Wt 317 lb 6.4 oz (143.972 kg)  BMI 45.54  kg/m2  SpO2 96%     Assessment & Plan:  1. Abscessed tooth - does not seem severe, will treat with broad spectrum antibiotic due to history of hip replacement - clindamycin (CLEOCIN) 300 MG capsule; Take two capsules today. Starting tomorrow, take one capsule four times a day until finished  Dispense: 26 capsule; Refill: 0 - Provided written and verbal information regarding diagnosis and treatment. - RTC precautions reviewed- fever over 101, severe headache, visual changes, persistent vomiting - follow up with dentist on Monday  Olean Reeeborah Etosha Wetherell, FNP-BC  [facility]  11/27/2015 11:50 AM

## 2017-02-09 ENCOUNTER — Encounter: Payer: Self-pay | Admitting: Physician Assistant

## 2017-02-09 ENCOUNTER — Ambulatory Visit (INDEPENDENT_AMBULATORY_CARE_PROVIDER_SITE_OTHER): Payer: Managed Care, Other (non HMO) | Admitting: Physician Assistant

## 2017-02-09 ENCOUNTER — Ambulatory Visit (INDEPENDENT_AMBULATORY_CARE_PROVIDER_SITE_OTHER): Payer: Commercial Indemnity

## 2017-02-09 VITALS — BP 120/86 | HR 98 | Temp 99.0°F | Resp 16 | Ht 70.0 in | Wt 299.6 lb

## 2017-02-09 DIAGNOSIS — T148XXA Other injury of unspecified body region, initial encounter: Secondary | ICD-10-CM

## 2017-02-09 DIAGNOSIS — M25571 Pain in right ankle and joints of right foot: Secondary | ICD-10-CM

## 2017-02-09 MED ORDER — NAPROXEN SODIUM 220 MG PO TABS: 440.0000 mg | ORAL_TABLET | Freq: Two times a day (BID) | ORAL | 1 refills | Status: AC

## 2017-02-09 NOTE — Patient Instructions (Addendum)
RICE: Rest, ice compression and elevation.  Put ice in a plastic bag. Place a towel between your skin and the bag or between your plaster splint and the bag. Leave the ice on for 20 minutes, 2-3 times a day. Ibuprofen and/or Tylenol for pain. Keep the area elevated above the level of your heart. Do this 2-3 times a day until swelling improves. Wear a walking boot until you are further evaluated by sports medicine. You will receive a phone call to schedule that appt.   Thank you for coming in today. I hope you feel we met your needs.  Feel free to call PCP if you have any questions or further requests.  Please consider signing up for MyChart if you do not already have it, as this is a great way to communicate with me.  Best,  Whitney McVey, PA-C     IF you received an x-ray today, you will receive an invoice from [facility]. Please contact [facility] at [phone] with questions or concerns regarding your invoice.   IF you received labwork today, you will receive an invoice from [facility]. Please contact [facility] at [phone] with questions or concerns regarding your invoice.   Our billing staff will not be able to assist you with questions regarding bills from these companies.  You will be contacted with the lab results as soon as they are available. The fastest way to get your results is to activate your My Chart account. Instructions are located on the last page of this paperwork. If you have not heard from [place] regarding the results in 2 weeks, please contact this office.

## 2017-02-09 NOTE — Progress Notes (Signed)
Maxwell Ellis  MRN: [id number] DOB: 07/31/1970  PCP: No primary care provider on file.  Subjective:  Pt is a pleasant 46 year old male who presents to clinic for right foot problem x 2 days. C/o bruising and pain located on the inside of his right ankle. Denies MOI.   H/o chronic ankle pain and instability. However, the past few weeks pain has worsened. This morning woke up with bruising on the inside of his right ankle.  Pain radiates up front of foot to his mid shin.  "I've always had a bad right ankle". He is a Museum/gallery conservatordirt bike racer, h/o "lots of falls from the bike". He has not been racing x 2 weeks. Chronic limp due to right foot/ankle pain.  Denies weakness, increased warmth, fever, chills.   Review of Systems  Musculoskeletal: Positive for arthralgias (right ankle), gait problem and joint swelling.  Skin: Positive for color change. Negative for wound.    There are no active problems to display for this patient.   Current Outpatient Prescriptions on File Prior to Visit  Medication Sig Dispense Refill  . Cholecalciferol (VITAMIN D PO) Take 6-7 capsules by mouth once a week.    . nicotine polacrilex (NICORETTE) 4 MG gum Take 8 mg by mouth 2 (two) times daily as needed for smoking cessation.    Marland Kitchen. zolpidem (AMBIEN) 10 MG tablet Take 10 mg by mouth at bedtime as needed for sleep.    . naproxen sodium (ANAPROX) 220 MG tablet Take 440 mg by mouth 2 (two) times daily with a meal.    . oxyCODONE-acetaminophen (PERCOCET/ROXICET) 5-325 MG per tablet Take 2 tablets by mouth every 6 (six) hours as needed for severe pain. (Patient not taking: Reported on 02/09/2017) 15 tablet 0   No current facility-administered medications on file prior to visit.     Allergies  Allergen Reactions  . Celebrex [Celecoxib]     Sob, rash     Objective:  BP (!) 141/89   Pulse 98   Temp 99 F (37.2 C) (Oral)   Resp 16   Ht 5\' 10"  (1.778 m)   Wt 299 lb 9.6 oz (135.9 kg)   SpO2 97%   BMI 42.99 kg/m     Physical Exam  Constitutional: He is oriented to person, place, and time and well-developed, well-nourished, and in no distress. No distress.  Cardiovascular: Normal rate, regular rhythm and normal heart sounds.   Musculoskeletal:       Right ankle: He exhibits swelling and ecchymosis. He exhibits no deformity and normal pulse. Tenderness. Medial malleolus tenderness found.       Feet:  Bruising  Neurological: He is alert and oriented to person, place, and time. GCS score is 15.  Skin: Skin is warm and dry.  Psychiatric: Mood, memory, affect and judgment normal.  Vitals reviewed.  Dg Ankle Complete Right  Result Date: 02/09/2017 CLINICAL DATA:  Ankle bruising and pain for 2 days. Tenderness at the medial malleolus. EXAM: RIGHT ANKLE - COMPLETE 3+ VIEW COMPARISON:  None. FINDINGS: Tenderness at the medial malleolus. There is mild soft tissue swelling about the ankle diffusely. No fracture or dislocation is identified. Bone mineralization appears normal. A small plantar calcaneal enthesophyte is noted. IMPRESSION: Soft tissue swelling without evidence of acute osseous abnormality. Electronically Signed   By: Maxwell AcheAllen  Grady M.D.   On: 02/09/2017 14:05    Assessment and Plan :  1. Pain in joint involving right ankle and foot 2. Bruising - DG  Ankle Complete Right; Future - Ambulatory referral to Sports Medicine - naproxen sodium (ANAPROX) 220 MG tablet; Take 2 tablets (440 mg total) by mouth 2 (two) times daily with a meal.  Dispense: 60 tablet; Refill: 1 - Negative x-ray, which is interesting given bruising and physical exam. H/o ankle pain, plan to refer to sports medicine for further evaluation and possible rehab.  Advised rest, ice, compression, elevation. He has a hard sole shoe at home and will wear this until eval by sports med. He agrees with plan.   Maxwell CollieWhitney Rhilee Currin, PA-C  [facility] at [facility] 02/09/2017 1:44 PM

## 2017-02-15 ENCOUNTER — Encounter: Payer: Self-pay | Admitting: Podiatry

## 2017-02-15 ENCOUNTER — Telehealth: Payer: Self-pay | Admitting: *Deleted

## 2017-02-15 ENCOUNTER — Ambulatory Visit: Payer: Managed Care, Other (non HMO)

## 2017-02-15 ENCOUNTER — Ambulatory Visit (INDEPENDENT_AMBULATORY_CARE_PROVIDER_SITE_OTHER): Payer: Managed Care, Other (non HMO) | Admitting: Podiatry

## 2017-02-15 ENCOUNTER — Ambulatory Visit (INDEPENDENT_AMBULATORY_CARE_PROVIDER_SITE_OTHER): Payer: Managed Care, Other (non HMO)

## 2017-02-15 VITALS — BP 145/97

## 2017-02-15 DIAGNOSIS — M205X9 Other deformities of toe(s) (acquired), unspecified foot: Secondary | ICD-10-CM

## 2017-02-15 DIAGNOSIS — M25571 Pain in right ankle and joints of right foot: Secondary | ICD-10-CM | POA: Diagnosis not present

## 2017-02-15 DIAGNOSIS — M779 Enthesopathy, unspecified: Secondary | ICD-10-CM

## 2017-02-15 MED ORDER — TRIAMCINOLONE ACETONIDE 10 MG/ML IJ SUSP
10.0000 mg | Freq: Once | INTRAMUSCULAR | Status: AC
  Administered 2017-02-15: 10 mg

## 2017-02-15 NOTE — Progress Notes (Signed)
Subjective:    Patient ID: Maxwell Ellis, male   DOB: 46 y.o.   MRN: [id number]   HPI patient presents stating he's had discomfort in his right ankle and it's intensified over the last several weeks to the point he's having trouble being active and also has had trouble with his big toe joints with bone spurs left over right that are increasingly becoming tender states she he's tried wider shoes and padding to accommodate without success    Review of Systems  All other systems reviewed and are negative.       Objective:  Physical Exam  Cardiovascular: Intact distal pulses.   Musculoskeletal: Normal range of motion.  Neurological: He is alert.  Skin: Skin is warm.  Nursing note and vitals reviewed.  neurovascular status found to be intact muscle strength adequate range of motion within normal limits with patient having some reduction range of motion of the subtalar joint right over left. There is inflammation in the medial ankle right with fusiform swelling around the posterior tib but it does appear to be intact and functioning. 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 I did discuss the bone spurs and I do think excision of the be done with cheilectomy which be very quick return to work and he is interested in this will discuss next week and today I did go ahead and I carefully injected around the posterior tibial tendon as it comes underneath the medial malleolus after first explaining chances for rupture and risk with 3 mg  Kenalog 5 mill grams Xylocaine into the sheath. I then applied air fracture walker to completely immobilize and reappoint again in 1 week  X-rays indicate that there is large spur formation first MPJ left over right with a small spur that loose and the right first MPJ. 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

## 2017-02-15 NOTE — Telephone Encounter (Signed)
Pt states he was to be prescribed an antiinflammatory by Dr. Charlsie Merlesegal. I informed pt that the antiinflammatory was in the injection Dr. Charlsie Merlesegal gave today.

## 2017-02-15 NOTE — Progress Notes (Signed)
   Subjective:    Patient ID: Maxwell Ellis, male    DOB: 08/07/1970, 46 y.o.   MRN: [id number]  HPI Chief Complaint  Patient presents with  . Foot Pain    Rt foot medial side foot pain for 1 week.      Review of Systems  Musculoskeletal: Positive for gait problem, joint swelling and myalgias.  All other systems reviewed and are negative.      Objective:   Physical Exam        Assessment & Plan:

## 2017-02-17 ENCOUNTER — Encounter: Payer: Self-pay | Admitting: Podiatry

## 2017-02-22 ENCOUNTER — Encounter: Payer: Self-pay | Admitting: Podiatry

## 2017-02-22 ENCOUNTER — Ambulatory Visit (INDEPENDENT_AMBULATORY_CARE_PROVIDER_SITE_OTHER): Payer: Managed Care, Other (non HMO) | Admitting: Podiatry

## 2017-02-22 DIAGNOSIS — M779 Enthesopathy, unspecified: Secondary | ICD-10-CM

## 2017-02-22 DIAGNOSIS — M205X9 Other deformities of toe(s) (acquired), unspecified foot: Secondary | ICD-10-CM | POA: Diagnosis not present

## 2017-02-22 MED ORDER — TRIAMCINOLONE ACETONIDE 10 MG/ML IJ SUSP
10.0000 mg | Freq: Once | INTRAMUSCULAR | Status: AC
  Administered 2017-02-22: 10 mg

## 2017-02-22 NOTE — Patient Instructions (Signed)
Pre-Operative Instructions  Congratulations, you have decided to take an important step towards improving your quality of life.  You can be assured that the doctors and staff at [facility] will be with you every step of the way.  Here are some important things you should know:  1. Plan to be at the surgery center/hospital at least 1 (one) hour prior to your scheduled time, unless otherwise directed by the surgical center/hospital staff.  You must have a responsible adult accompany you, remain during the surgery and drive you home.  Make sure you have directions to the surgical center/hospital to ensure you arrive on time. 2. If you are having surgery at [facility] or [facility] [place] hospitals, you will need a copy of your medical history and physical form from your family physician within one month prior to the date of surgery. We will give you a form for your primary physician to complete.  3. We make every effort to accommodate the date you request for surgery.  However, there are times where surgery dates or times have to be moved.  We will contact you as soon as possible if a change in schedule is required.   4. No aspirin/ibuprofen for one week before surgery.  If you are on aspirin, any non-steroidal anti-inflammatory medications (Mobic, Aleve, Ibuprofen) should not be taken seven (7) days prior to your surgery.  You make take Tylenol for pain prior to surgery.  5. Medications - If you are taking daily heart and blood pressure medications, seizure, reflux, allergy, asthma, anxiety, pain or diabetes medications, make sure you notify the surgery center/hospital before the day of surgery so they can tell you which medications you should take or avoid the day of surgery. 6. No food or drink after midnight the night before surgery unless directed otherwise by surgical center/hospital staff. 7. No alcoholic beverages 24-hours prior to surgery.  No smoking 24-hours prior or 24-hours after  surgery. 8. Wear loose pants or shorts. They should be loose enough to fit over bandages, boots, and casts. 9. Don't wear slip-on shoes. Sneakers are preferred. 10. Bring your boot with you to the surgery center/hospital.  Also bring crutches or a walker if your physician has prescribed it for you.  If you do not have this equipment, it will be provided for you after surgery. 11. If you have not been contacted by the surgery center/hospital by the day before your surgery, call to confirm the date and time of your surgery. 12. Leave-time from work may vary depending on the type of surgery you have.  Appropriate arrangements should be made prior to surgery with your employer. 13. Prescriptions will be provided immediately following surgery by your doctor.  Fill these as soon as possible after surgery and take the medication as directed. Pain medications will not be refilled on weekends and must be approved by the doctor. 14. Remove nail polish on the operative foot and avoid getting pedicures prior to surgery. 15. Wash the night before surgery.  The night before surgery wash the foot and leg well with water and the antibacterial soap provided. Be sure to pay special attention to beneath the toenails and in between the toes.  Wash for at least three (3) minutes. Rinse thoroughly with water and dry well with a towel.  Perform this wash unless told not to do so by your physician.  Enclosed: 1 Ice pack (please put in freezer the night before surgery)   1 Hibiclens skin cleaner     Pre-op instructions  If you have any questions regarding the instructions, please do not hesitate to call our office.  [place]: [address], , [place] [address] -- [phone]  [place]: [address] [place], [place] [address] -- [phone]  [place]: [address]  [place], [place] [address] -- [phone]  [place]: [address], [address], [place], [place] [address] -- [phone]  Website:  [email or web address] 

## 2017-02-23 NOTE — Progress Notes (Signed)
Subjective:    Patient ID: Maxwell Ellis, male   DOB: 46 y.o.   MRN: [id number]   HPI patient presents stating that his ankle is feeling some better with still soreness and it hurts more top of his foot and around the side and he wants to get these big toe joints fixed with chronic discomfort and states she's tried wider shoes she's tried to pad them cushion them and soak without relief    ROS      Objective:  Physical Exam neurovascular status intact with patient's right medial ankle improved with mild discomfort upon deep palpation with quite a bit of discomfort on the dorsal lateral aspect of the right foot with inflammation and noted to have large spurs first metatarsal head bilateral with diminished range of motion mild crepitus noted     Assessment:    Improving from posterior tibial tendinitis right with lateral tendinitis right and hallux limitus rigidus deformity bilateral with spur formation and inability to wear shoe gear with any degree of comfort     Plan:    H&P and all conditions reviewed separately. As far as the tendon goes she will continue with wearing the boot and long-term orthotics will be of benefit and today I did go ahead and carefully injected the lateral tendon complex 3 mg Kenalog 5 mg Xylocaine. Then discussed the hallux limitus and I have recommended removal of dorsal spur spurs with removal of bunion deformity bilateral and allowed him to read consent form going over procedure and alternative treatments complications. Patient wants surgery understanding risk and at this time signs consent form after extensive review and understands total recovery can take 6 months to one year and ultimately he may require implant or possible fusion procedures. Understands total final recovery can take 6 months to one year and is scheduled for outpatient surgery

## 2017-02-24 ENCOUNTER — Telehealth: Payer: Self-pay | Admitting: *Deleted

## 2017-02-24 NOTE — Telephone Encounter (Signed)
"  I'm scheduled for surgery on August 21.  I have been trying to go online to register but it keeps kicking me out.  I was told I had to do this within 24 hours."   Don't worry about it, someone from the surgical center will call you to get the information that is needed.

## 2017-03-07 ENCOUNTER — Encounter: Payer: Self-pay | Admitting: Podiatry

## 2017-03-07 DIAGNOSIS — M2011 Hallux valgus (acquired), right foot: Secondary | ICD-10-CM | POA: Diagnosis not present

## 2017-03-07 DIAGNOSIS — M2012 Hallux valgus (acquired), left foot: Secondary | ICD-10-CM | POA: Diagnosis not present

## 2017-03-15 ENCOUNTER — Encounter: Payer: Self-pay | Admitting: Podiatry

## 2017-03-15 ENCOUNTER — Ambulatory Visit (INDEPENDENT_AMBULATORY_CARE_PROVIDER_SITE_OTHER): Payer: Managed Care, Other (non HMO) | Admitting: Podiatry

## 2017-03-15 ENCOUNTER — Ambulatory Visit (INDEPENDENT_AMBULATORY_CARE_PROVIDER_SITE_OTHER): Payer: Managed Care, Other (non HMO)

## 2017-03-15 VITALS — Temp 98.3°F

## 2017-03-15 DIAGNOSIS — M2011 Hallux valgus (acquired), right foot: Secondary | ICD-10-CM | POA: Diagnosis not present

## 2017-03-15 DIAGNOSIS — M2012 Hallux valgus (acquired), left foot: Secondary | ICD-10-CM

## 2017-03-15 DIAGNOSIS — M205X9 Other deformities of toe(s) (acquired), unspecified foot: Secondary | ICD-10-CM

## 2017-03-15 NOTE — Progress Notes (Signed)
Subjective:    Patient ID: Maxwell Ellis, male   DOB: 46 y.o.   MRN: [id number]   HPI patient states he's doing real well with both feet with minimal discomfort or swelling and is starting to get around quite a bit    ROS      Objective:  Physical Exam neurovascular status intact with well-healing surgical sites first metatarsal bilateral with no drainage wound edges well coapted and good range of motion with adequate removal of spurring     Assessment:    Doing well post surgical resection of spurring and modified McBride bilateral     Plan:   Advised on importance of continued hygiene elevation compression and sterile dressings which were reapplied. Reappoint in 2 weeks or earlier if needed and gave strict instructions of any redness were to occur or drainage to contact us immediately  X-rays indicated that there is excellent resection of bone from the dorsum of the bilateral

## 2017-03-21 NOTE — Progress Notes (Signed)
DOS 03/07/2017 Modified McBride Bunionectomy B/L

## 2017-03-22 ENCOUNTER — Encounter: Payer: Managed Care, Other (non HMO) | Admitting: Podiatry

## 2017-04-06 ENCOUNTER — Ambulatory Visit: Payer: Managed Care, Other (non HMO)

## 2017-04-06 ENCOUNTER — Ambulatory Visit: Payer: Managed Care, Other (non HMO) | Admitting: Podiatry

## 2017-04-07 ENCOUNTER — Ambulatory Visit (INDEPENDENT_AMBULATORY_CARE_PROVIDER_SITE_OTHER): Payer: Managed Care, Other (non HMO)

## 2017-04-07 ENCOUNTER — Ambulatory Visit (INDEPENDENT_AMBULATORY_CARE_PROVIDER_SITE_OTHER): Payer: Managed Care, Other (non HMO) | Admitting: Podiatry

## 2017-04-07 DIAGNOSIS — M2011 Hallux valgus (acquired), right foot: Secondary | ICD-10-CM | POA: Diagnosis not present

## 2017-04-07 DIAGNOSIS — M205X9 Other deformities of toe(s) (acquired), unspecified foot: Secondary | ICD-10-CM

## 2017-04-07 DIAGNOSIS — M2012 Hallux valgus (acquired), left foot: Secondary | ICD-10-CM

## 2017-04-07 NOTE — Progress Notes (Signed)
Subjective:    Patient ID: Maxwell Ellis, male   DOB: 46 y.o.   MRN: [id number]   HPI patient states doing real well with surgery    ROS      Objective:  Physical Exam neurovascular status intact negative Homans sign noted with first MPJ bilateral healing well wound edges well coapted with moderate edema secondary to excessive activity     Assessment:    Doing well post cheilectomy bilateral     Plan:   Advised him the importance of compression and dispensed ankle compression stockings advised on continued elevation surgical shoe usage with gradual return to normal shoes and reappoint in 6 weeks  X-rays indicate satisfactory position of the first metatarsal with excellent spur resection

## 2017-04-13 NOTE — Progress Notes (Signed)
This encounter was created in error - please disregard.

## 2017-05-19 ENCOUNTER — Ambulatory Visit: Payer: Managed Care, Other (non HMO)

## 2017-05-19 ENCOUNTER — Ambulatory Visit (INDEPENDENT_AMBULATORY_CARE_PROVIDER_SITE_OTHER): Payer: Managed Care, Other (non HMO) | Admitting: Podiatry

## 2017-05-19 DIAGNOSIS — M2012 Hallux valgus (acquired), left foot: Principal | ICD-10-CM

## 2017-05-19 DIAGNOSIS — M779 Enthesopathy, unspecified: Secondary | ICD-10-CM

## 2017-05-19 DIAGNOSIS — M205X9 Other deformities of toe(s) (acquired), unspecified foot: Secondary | ICD-10-CM

## 2017-05-19 DIAGNOSIS — M2011 Hallux valgus (acquired), right foot: Secondary | ICD-10-CM

## 2017-05-23 NOTE — Progress Notes (Signed)
Subjective:    Patient ID: Maxwell Ellis, male   DOB: 46 y.o.   MRN: [id number]   HPI patient states she's doing very well and very happy with his results and the tendon into his ankle is improved with soreness occasionally    ROS      Objective:  Physical Exam neurovascular status intact with patient's first MPJ functioning very well bilateral with good range of motion no crepitus in the tendon right doing well with mild inflammation     Assessment:   Doing well post cheilectomy bilateral with tendinitis right that's improved      Plan:    H&P condition reviewed x-rays reviewed and allow patient return to normal activity and discussed possible treatment for the tendon if it were to become symptomatic again  X-rays indicate that the bone structure looks good with satisfactory section of bone

## 2018-04-13 IMAGING — DX DG ANKLE COMPLETE 3+V*R*
3 series · 3 of 3 positions shown · non-contrast
Comparison: None.

CLINICAL DATA: Ankle bruising and pain for 2 days. Tenderness at
the medial malleolus.

EXAM:
RIGHT ANKLE - COMPLETE 3+ VIEW

[ankle ap]
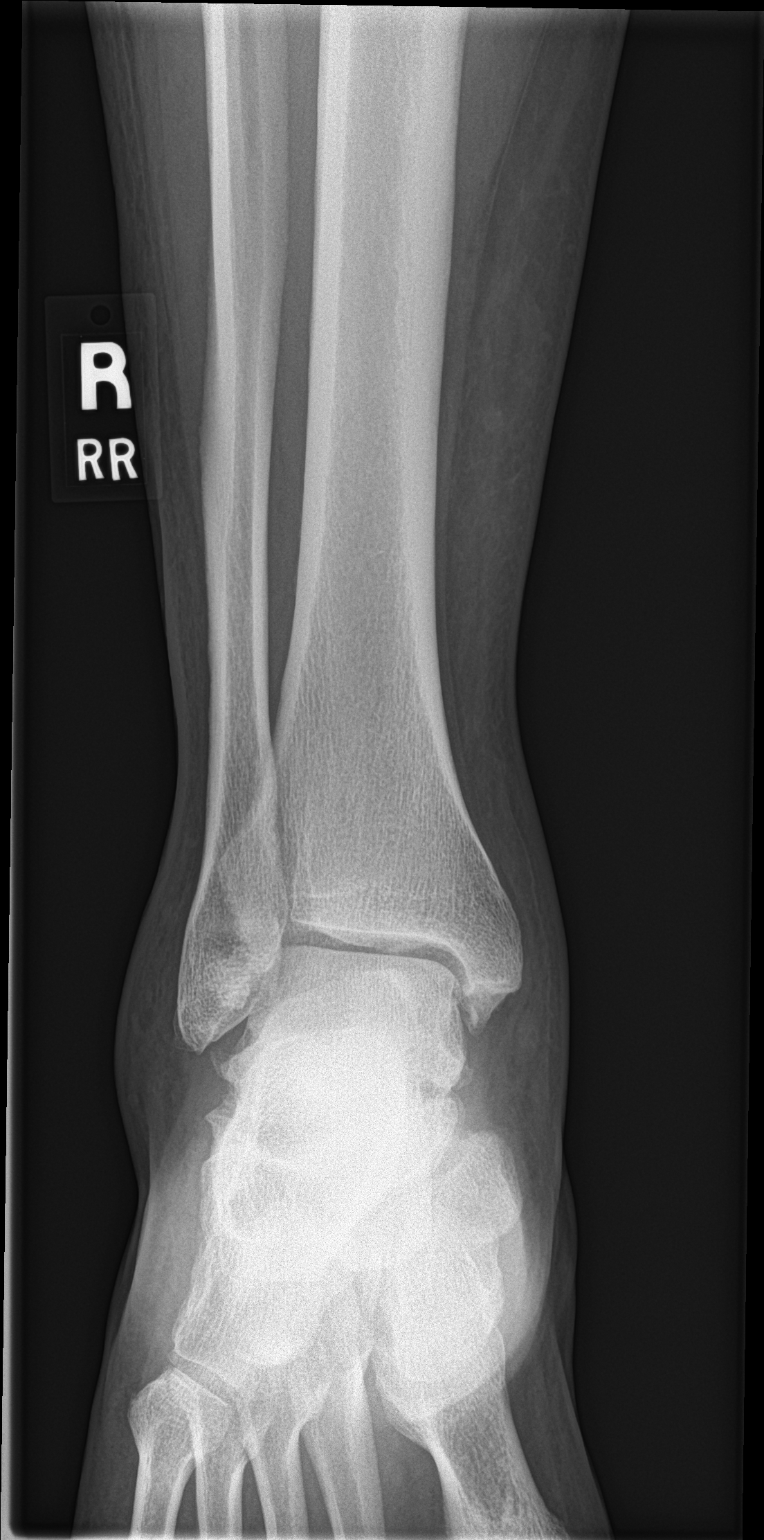

[ankle obl]
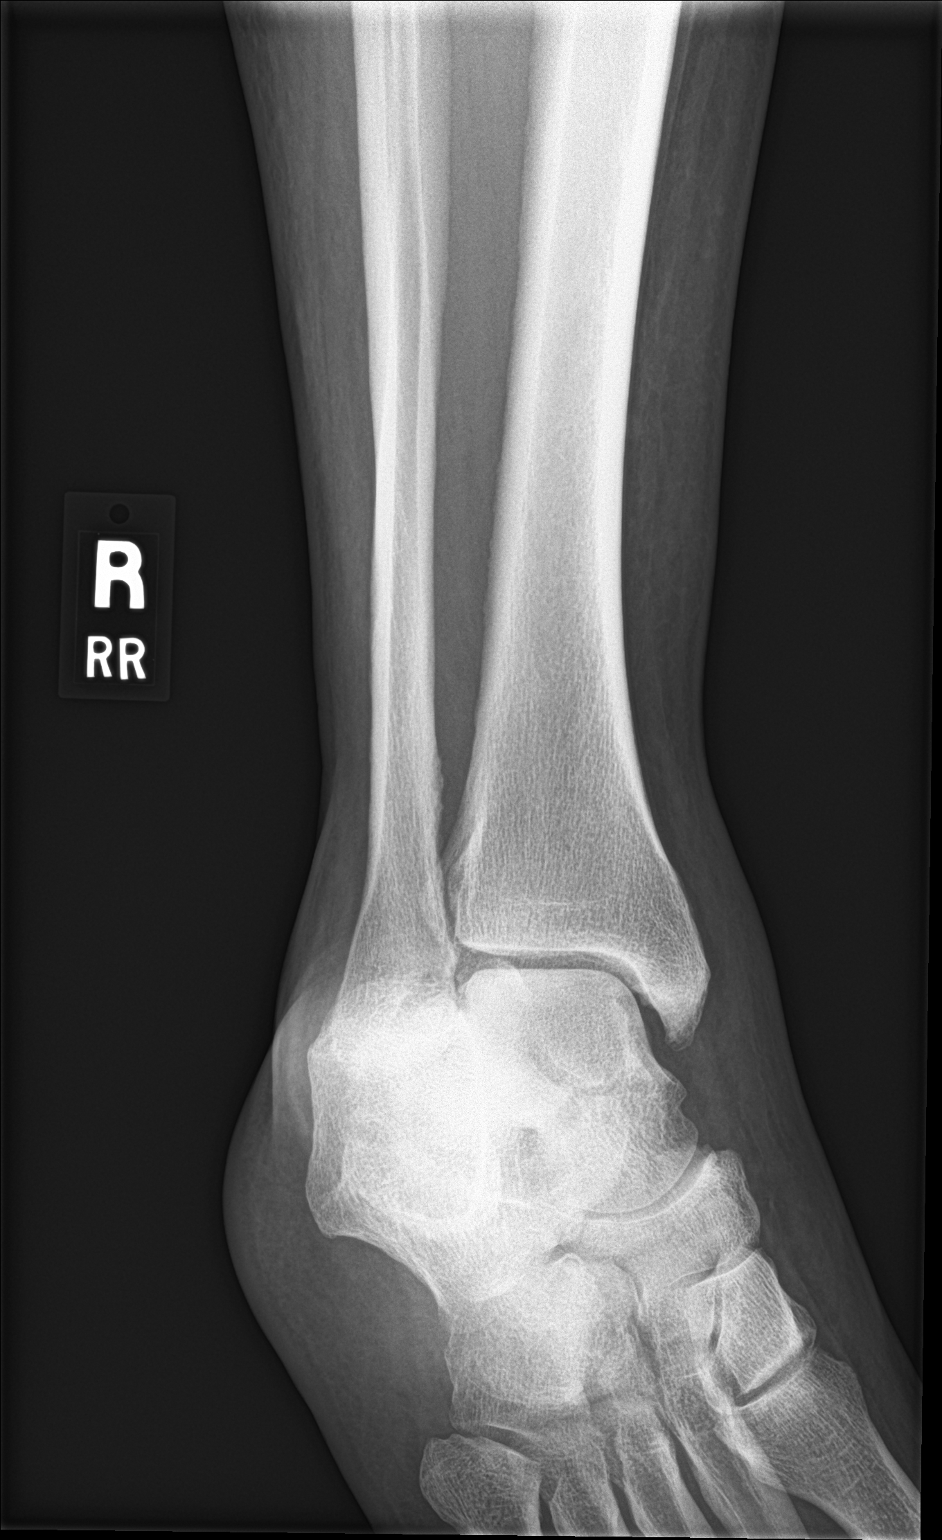

[ankle lat]
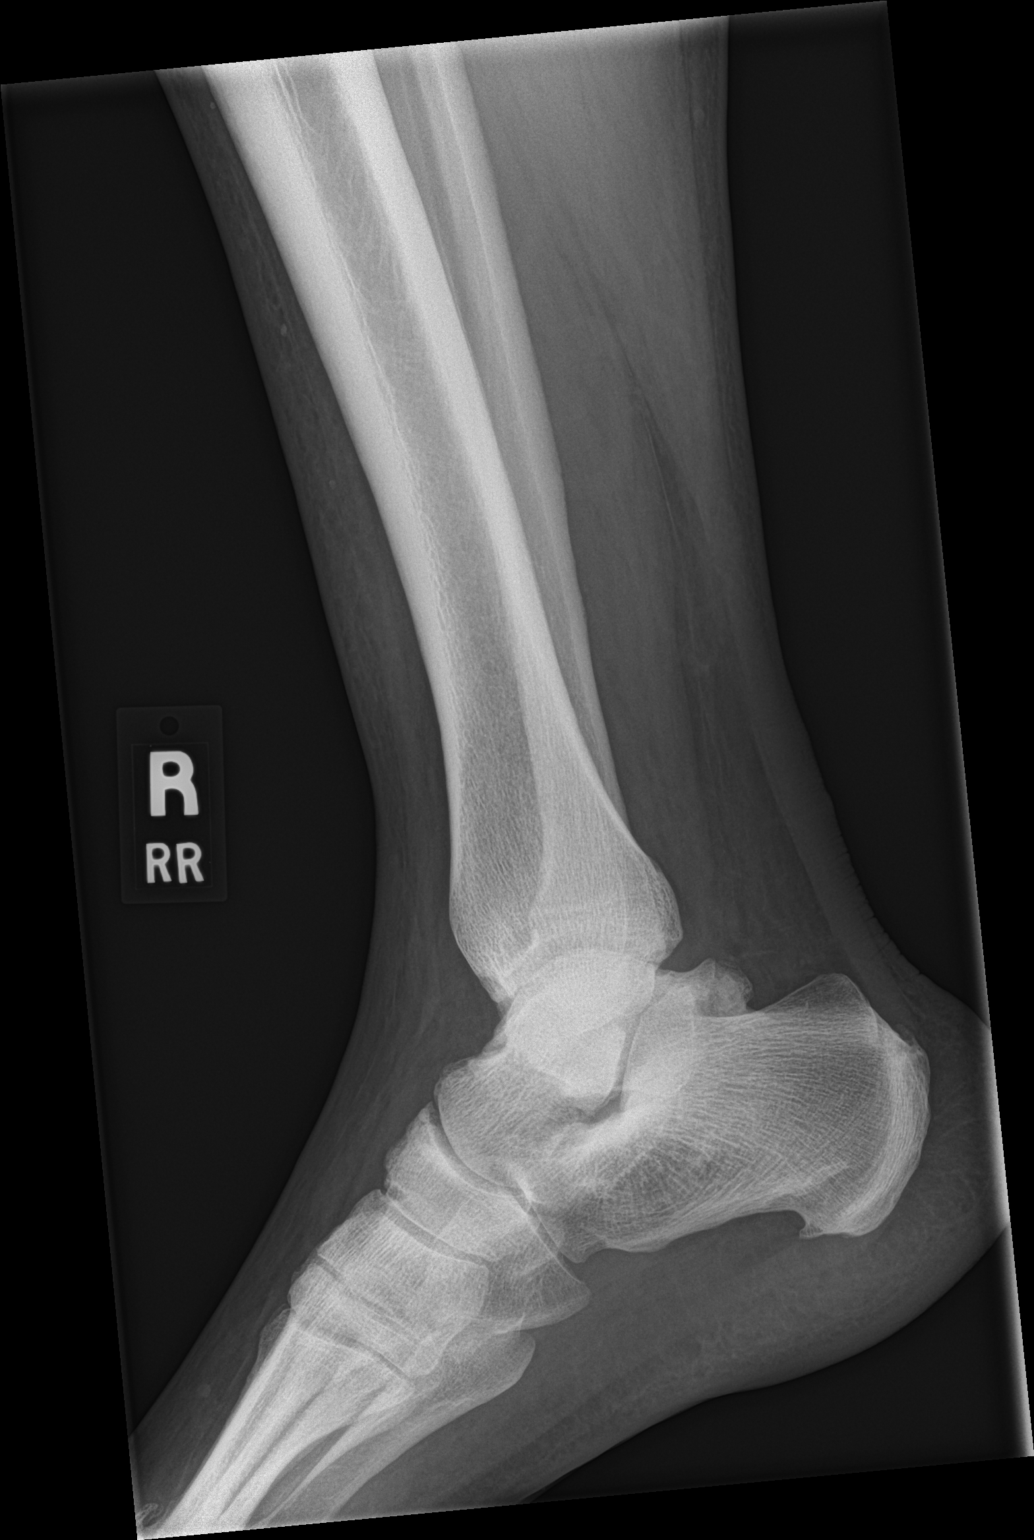

[3 of 3 positions shown; findings below may reference images not displayed]

FINDINGS: Tenderness at the medial malleolus. There is mild soft tissue
swelling about the ankle diffusely. No fracture or dislocation is
identified. Bone mineralization appears normal. A small plantar
calcaneal enthesophyte is noted.
IMPRESSION: Soft tissue swelling without evidence of acute osseous abnormality.
